# Patient Record
Sex: Male | Born: 1945 | Race: White | Hispanic: No | Marital: Single | Smoking: Former smoker
Health system: Southern US, Community
[De-identification: ages and names within clinical notes are randomized; demographics above are authoritative.]

## PROBLEM LIST (undated history)

## (undated) DIAGNOSIS — R0600 Dyspnea, unspecified: Secondary | ICD-10-CM

## (undated) DIAGNOSIS — E785 Hyperlipidemia, unspecified: Secondary | ICD-10-CM

## (undated) DIAGNOSIS — F32A Depression, unspecified: Secondary | ICD-10-CM

---
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
## 1998-09-27 DIAGNOSIS — J189 Pneumonia, unspecified organism: Secondary | ICD-10-CM

## 1998-09-27 HISTORY — PX: EYE SURGERY: SHX253

## 1998-09-27 HISTORY — DX: Pneumonia, unspecified organism: J18.9

## 1999-07-01 ENCOUNTER — Ambulatory Visit (HOSPITAL_COMMUNITY): Admission: RE | Admit: 1999-07-01 | Discharge: 1999-07-01 | Payer: Self-pay | Admitting: Gastroenterology

## 2007-01-20 ENCOUNTER — Inpatient Hospital Stay (HOSPITAL_COMMUNITY): Admission: EM | Admit: 2007-01-20 | Discharge: 2007-02-03 | Payer: Self-pay | Admitting: Emergency Medicine

## 2007-01-20 ENCOUNTER — Ambulatory Visit: Payer: Self-pay | Admitting: Pulmonary Disease

## 2007-01-20 ENCOUNTER — Ambulatory Visit: Payer: Self-pay | Admitting: Infectious Diseases

## 2008-09-27 DIAGNOSIS — J449 Chronic obstructive pulmonary disease, unspecified: Secondary | ICD-10-CM

## 2008-09-27 DIAGNOSIS — R519 Headache, unspecified: Secondary | ICD-10-CM

## 2008-09-27 HISTORY — DX: Chronic obstructive pulmonary disease, unspecified: J44.9

## 2008-09-27 HISTORY — DX: Headache, unspecified: R51.9

## 2015-09-28 HISTORY — PX: HERNIA REPAIR: SHX51

## 2015-09-28 HISTORY — PX: DIAGNOSTIC LAPAROSCOPY: SUR761

## 2017-09-27 DIAGNOSIS — I209 Angina pectoris, unspecified: Secondary | ICD-10-CM

## 2017-09-27 HISTORY — DX: Angina pectoris, unspecified: I20.9

## 2021-02-27 ENCOUNTER — Other Ambulatory Visit (HOSPITAL_COMMUNITY): Payer: Self-pay | Admitting: Student

## 2021-02-27 ENCOUNTER — Other Ambulatory Visit: Payer: Self-pay | Admitting: Student

## 2021-02-27 DIAGNOSIS — C3490 Malignant neoplasm of unspecified part of unspecified bronchus or lung: Secondary | ICD-10-CM

## 2021-03-13 ENCOUNTER — Other Ambulatory Visit: Payer: Self-pay

## 2021-03-13 ENCOUNTER — Ambulatory Visit (HOSPITAL_COMMUNITY)
Admission: RE | Admit: 2021-03-13 | Discharge: 2021-03-13 | Disposition: A | Discharge status: Home / Self Care | Payer: No Typology Code available for payment source | Source: Ambulatory Visit | Attending: Student | Admitting: Student

## 2021-03-13 DIAGNOSIS — C3490 Malignant neoplasm of unspecified part of unspecified bronchus or lung: Secondary | ICD-10-CM | POA: Diagnosis not present

## 2021-03-13 LAB — GLUCOSE, CAPILLARY: Glucose-Capillary: 97 mg/dL (ref 70–99)

## 2021-03-13 MED ORDER — FLUDEOXYGLUCOSE F - 18 (FDG) INJECTION
6.8000 | Freq: Once | INTRAVENOUS | Status: AC
  Administered 2021-03-13: 6.8 via INTRAVENOUS

## 2021-03-24 ENCOUNTER — Encounter: Payer: Self-pay | Admitting: Thoracic Surgery (Cardiothoracic Vascular Surgery)

## 2021-03-24 ENCOUNTER — Other Ambulatory Visit: Payer: Self-pay | Admitting: Thoracic Surgery (Cardiothoracic Vascular Surgery)

## 2021-03-24 ENCOUNTER — Other Ambulatory Visit: Payer: Self-pay

## 2021-03-24 ENCOUNTER — Institutional Professional Consult (permissible substitution) (INDEPENDENT_AMBULATORY_CARE_PROVIDER_SITE_OTHER): Payer: No Typology Code available for payment source | Admitting: Thoracic Surgery (Cardiothoracic Vascular Surgery)

## 2021-03-24 VITALS — BP 133/79 | HR 101 | Resp 20 | Wt 137.0 lb

## 2021-03-24 DIAGNOSIS — R911 Solitary pulmonary nodule: Secondary | ICD-10-CM

## 2021-03-24 NOTE — Progress Notes (Signed)
[address]       [place] [address]             [phone]                    Dezmen Carbary [place] Medical Record #[id number] Date of Birth: March 22, 1946  Referring: Rhoderick Moody, MD Primary Care: Windy Fast, MD Primary Cardiologist: None  Chief Complaint:    Chief Complaint  Patient presents with   Lung Lesion    Initial surgical consult PET 6/17, CT chest 4/25    History of Present Illness:    Kurt Bolton 75 y.o. male presents for surgical evaluation of a spiculated left upper lobe pulmonary nodule.  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  He continues to smoke but has cut down to half a pack a day.  He was in the lung cancer screening program through the [place], and was noted to have a suspicious nodule that was followed.  He subsequently underwent a PET/CT which showed avidity within this lesion.  He was then referred to cardiothoracic surgery for further evaluation.  The patient denies any weight changes, and has maintained her weight between 130 and 135 pounds for several years.  He denies any neurologic symptoms.  He does complain of groin pain but this is due to a recurrence in his inguinal hernia.  When he exerts himself he does have some shortness of breath, but denies any chest pain.    Smoking Hx: Continues to smoke half a pack a day.   Zubrod Score: At the time of surgery this patient's most appropriate activity status/level should be described as: []     0    Normal activity, no symptoms [x]     1    Restricted in physical strenuous activity but ambulatory, able to do out light work []     2    Ambulatory and capable of self care, unable to do work activities, up and about               >50 % of waking hours                              []     3    Only limited self care, in bed greater than 50% of waking hours []     4    Completely disabled, no self care, confined to bed or chair []     5    Moribund   No  past medical history on file.   No family history on file.   Social History   Tobacco Use  Smoking Status Not on file  Smokeless Tobacco Not on file    Social History   Substance and Sexual Activity  Alcohol Use Not on file     Not on File  Current Outpatient Medications  Medication Sig Dispense Refill   atorvastatin (LIPITOR) 40 MG tablet Take 40 mg by mouth daily.     gabapentin (NEURONTIN) 100 MG capsule Take 100 mg by mouth 3 (three) times daily.     Multiple Vitamin (MULTIVITAMIN) tablet Take 1 tablet by mouth daily.     No current facility-administered medications for this visit.    Review of Systems  Constitutional:  Negative for malaise/fatigue and weight loss.  Respiratory:  Positive for shortness of breath.   Cardiovascular:  Negative for chest pain.  Musculoskeletal:  Positive for myalgias.  Neurological: Negative.     PHYSICAL EXAMINATION: BP 133/79 (BP Location: Left Arm, Patient Position: Sitting)   Pulse (!) 101   Resp 20   Wt 137 lb (62.1 kg)   SpO2 94% Comment: RA Physical Exam Constitutional:      General: He is not in acute distress.    Appearance: He is ill-appearing. He is not toxic-appearing or diaphoretic.     Comments: Smells of smoke. Thin and very frail.  Somewhat disheveled.  HENT:     Head: Normocephalic and atraumatic.  Eyes:     Extraocular Movements: Extraocular movements intact.  Cardiovascular:     Rate and Rhythm: Tachycardia present.  Pulmonary:     Effort: Pulmonary effort is normal. No respiratory distress.  Abdominal:     General: Abdomen is flat. There is no distension.  Musculoskeletal:        General: Normal range of motion.     Cervical back: Normal range of motion.  Neurological:     General: No focal deficit present.     Mental Status: He is alert and oriented to person, place, and time.    Diagnostic Studies & Laboratory data:     Recent Radiology Findings:   NM PET Image Initial (PI) Skull Base To  Thigh  Result Date: 03/16/2021 CLINICAL DATA:  Initial treatment strategy for "staging of lung cancer". No further clinical data available or submitted. EXAM: NUCLEAR MEDICINE PET SKULL BASE TO THIGH TECHNIQUE: 6.8 mCi F-18 FDG was injected intravenously. Full-ring PET imaging was performed from the skull base to thigh after the radiotracer. CT data was obtained and used for attenuation correction and anatomic localization. Fasting blood glucose: 97 mg/dl COMPARISON:  No recent imaging available or submitted. A chest CT of 01/21/2007 is reviewed. FINDINGS: Mediastinal blood pool activity: SUV max 1.8 Liver activity: SUV max NA NECK: No areas of abnormal hypermetabolism. Incidental CT findings: Bilateral carotid atherosclerosis. No cervical adenopathy. CHEST: Left apical spiculated hypermetabolic pulmonary nodule including at 1.8 x 1.9 cm and a S.U.V. max of 10.1 on 29/4 and 7/8. No thoracic nodal hypermetabolism. Incidental CT findings: Right upper lobe cystic bronchiectasis and volume loss are the sequelae of necrotizing pneumonia when compared to the 2008 CT. Moderate centrilobular emphysema. Right middle lobe calcified granuloma. No thoracic adenopathy. There are calcified mediastinal and right hilar nodes which are likely related to old granulomatous disease. Aortic and coronary artery calcification. ABDOMEN/PELVIS: No abdominopelvic parenchymal or nodal hypermetabolism. Incidental CT findings: Normal adrenal glands. Old granulomatous disease in the spleen. Dependent gallstone or stones on 108/4. Abdominal aortic atherosclerosis. Infrarenal abdominal aortic dilatation at up to 3.2 cm. Left inguinal hernia contains nonobstructive left-sided colon. SKELETON: No abnormal marrow activity. Incidental CT findings: Osteopenia. IMPRESSION: 1. Hypermetabolic left apical pulmonary nodule, consistent with primary bronchogenic carcinoma. Presuming non-small-cell histology, T1bN0M0 or stage IA. 2. Sequelae of right upper  lobe cavitary pneumonia. 3. Aortic atherosclerosis (ICD10-I70.0), coronary artery atherosclerosis and emphysema (ICD10-J43.9). 4. Infrarenal abdominal aortic dilatation of up to 3.2 cm. Recommend follow-up ultrasound every 3 years. This recommendation follows ACR consensus guidelines: White Paper of the ACR Incidental Findings Committee II on Vascular Findings. J Am Coll Radiol 2013; 10:789-794. 5. Cholelithiasis 6. Nonobstructive colon containing left inguinal hernia. Electronically Signed   By: Abigail Miyamoto M.D.   On: 03/16/2021 09:36       I have independently reviewed the above radiology studies  and reviewed the findings with the patient.   Recent Lab  Findings: No results found for: WBC, HGB, HCT, PLT, GLUCOSE, CHOL, TRIG, HDL, LDLDIRECT, LDLCALC, ALT, AST, NA, K, CL, CREATININE, BUN, CO2, TSH, INR, GLUF, HGBA1C   PFTs: Pending Problem List: 1.9 cm left upper lobe pulmonary nodule with SUV of 10.1. Ongoing smoking Exertional dyspnea  Assessment / Plan:   75 year old male with a 1.9 cm left upper lobe pulmonary nodule with SUV uptake of 10.1.  Given his smoking history this is very concerning for primary lung cancer.  We will require pulmonary function testing to assess his lung reserve to determine whether or not he is a surgical candidate.  He states that he is willing to stop smoking.  If he is a surgical candidate he will also need a stress test given his exertional dyspnea.:  Once the pulmonary function tests are resulted, will discuss his case with Dr.Icard for obtaining a tissue diagnosis.  He is scheduled for a virtual visit with me after his pulmonary function testing.  He also appears quite frail and does not have much if any social support.     I  spent 40 minutes with  the patient face to face in counseling and coordination of care.    Lajuana Matte 03/24/2021 4:14 PM

## 2021-03-26 ENCOUNTER — Encounter: Payer: No Typology Code available for payment source | Admitting: Thoracic Surgery (Cardiothoracic Vascular Surgery)

## 2021-03-31 ENCOUNTER — Other Ambulatory Visit (HOSPITAL_COMMUNITY): Payer: No Typology Code available for payment source

## 2021-04-01 ENCOUNTER — Other Ambulatory Visit (HOSPITAL_COMMUNITY)
Admission: RE | Admit: 2021-04-01 | Discharge: 2021-04-01 | Disposition: A | Discharge status: Home / Self Care | Payer: No Typology Code available for payment source | Source: Ambulatory Visit | Attending: Thoracic Surgery (Cardiothoracic Vascular Surgery) | Admitting: Thoracic Surgery (Cardiothoracic Vascular Surgery)

## 2021-04-01 DIAGNOSIS — Z01812 Encounter for preprocedural laboratory examination: Secondary | ICD-10-CM | POA: Insufficient documentation

## 2021-04-01 DIAGNOSIS — Z20822 Contact with and (suspected) exposure to covid-19: Secondary | ICD-10-CM | POA: Diagnosis not present

## 2021-04-02 ENCOUNTER — Inpatient Hospital Stay (HOSPITAL_COMMUNITY): Admission: RE | Admit: 2021-04-02 | Payer: No Typology Code available for payment source | Source: Ambulatory Visit

## 2021-04-02 LAB — SARS CORONAVIRUS 2 (TAT 6-24 HRS): SARS Coronavirus 2: NEGATIVE

## 2021-04-03 ENCOUNTER — Ambulatory Visit (HOSPITAL_COMMUNITY)
Admission: RE | Admit: 2021-04-03 | Discharge: 2021-04-03 | Disposition: A | Discharge status: Home / Self Care | Payer: No Typology Code available for payment source | Source: Ambulatory Visit | Attending: Thoracic Surgery (Cardiothoracic Vascular Surgery) | Admitting: Thoracic Surgery (Cardiothoracic Vascular Surgery)

## 2021-04-03 ENCOUNTER — Other Ambulatory Visit: Payer: Self-pay | Admitting: Thoracic Surgery (Cardiothoracic Vascular Surgery)

## 2021-04-03 ENCOUNTER — Other Ambulatory Visit: Payer: Self-pay

## 2021-04-03 ENCOUNTER — Telehealth (INDEPENDENT_AMBULATORY_CARE_PROVIDER_SITE_OTHER): Payer: No Typology Code available for payment source | Admitting: Thoracic Surgery (Cardiothoracic Vascular Surgery)

## 2021-04-03 DIAGNOSIS — R911 Solitary pulmonary nodule: Secondary | ICD-10-CM

## 2021-04-03 LAB — PULMONARY FUNCTION TEST
DL/VA % pred: 69 %
DL/VA: 2.7 ml/min/mmHg/L
DLCO unc % pred: 55 %
DLCO unc: 14.83 ml/min/mmHg
FEF 25-75 Post: 0.62 L/sec
FEF 25-75 Pre: 1.52 L/sec
FEF2575-%Change-Post: -58 %
FEF2575-%Pred-Post: 26 %
FEF2575-%Pred-Pre: 64 %
FEV1-%Change-Post: -4 %
FEV1-%Pred-Post: 72 %
FEV1-%Pred-Pre: 76 %
FEV1-Post: 2.38 L
FEV1-Pre: 2.5 L
FEV1FVC-%Change-Post: -7 %
FEV1FVC-%Pred-Pre: 93 %
FEV6-%Change-Post: -4 %
FEV6-%Pred-Post: 82 %
FEV6-%Pred-Pre: 86 %
FEV6-Post: 3.51 L
FEV6-Pre: 3.67 L
FEV6FVC-%Change-Post: -4 %
FEV6FVC-%Pred-Post: 101 %
FEV6FVC-%Pred-Pre: 106 %
FVC-%Change-Post: 3 %
FVC-%Pred-Post: 84 %
FVC-%Pred-Pre: 81 %
FVC-Post: 3.81 L
FVC-Pre: 3.68 L
Post FEV1/FVC ratio: 62 %
Post FEV6/FVC ratio: 95 %
Pre FEV1/FVC ratio: 68 %
Pre FEV6/FVC Ratio: 100 %
RV % pred: 118 %
RV: 3.19 L
TLC % pred: 92 %
TLC: 6.92 L

## 2021-04-03 MED ORDER — ALBUTEROL SULFATE (2.5 MG/3ML) 0.083% IN NEBU
2.5000 mg | INHALATION_SOLUTION | Freq: Once | RESPIRATORY_TRACT | Status: AC
  Administered 2021-04-03: 2.5 mg via RESPIRATORY_TRACT

## 2021-04-03 NOTE — H&P (View-Only) (Signed)
     [address]       [place] [address]             [phone]       Patient: Home Provider: Office Consent for Telemedicine visit obtained.  Today's visit was completed via a real-time telehealth (see specific modality noted below). The patient/authorized person provided oral consent at the time of the visit to engage in a telemedicine encounter with the present provider at [facility]. The patient/authorized person was informed of the potential benefits, limitations, and risks of telemedicine. The patient/authorized person expressed understanding that the laws that protect confidentiality also apply to telemedicine. The patient/authorized person acknowledged understanding that telemedicine does not provide emergency services and that he or she would need to call 911 or proceed to the nearest hospital for help if such a need arose.   Total time spent in the clinical discussion 10 minutes.  Telehealth Modality: Phone visit (audio only)  I had a telephone visit with Kurt Bolton.  This is a 75 year old gentleman with a left upper lobe pulmonary nodule concerning for primary malignancy.  I reviewed his pulmonary function tests most are fine except for his DLCO which is 55%.  He does continue to smoke but is cut down.  He would like to proceed with surgery and states that he will be off cigarettes as of today.  We will schedule him for a combination procedure with Dr.Icard which will include a navigational bronchoscopy as well as a robotic assisted left upper lobectomy.  I have put in for stress test and following the results we will schedule surgery within the next 2 weeks.

## 2021-04-03 NOTE — Progress Notes (Signed)
     [address]       [place] [address]             [phone]       Patient: Home Provider: Office Consent for Telemedicine visit obtained.  Today's visit was completed via a real-time telehealth (see specific modality noted below). The patient/authorized person provided oral consent at the time of the visit to engage in a telemedicine encounter with the present provider at [facility]. The patient/authorized person was informed of the potential benefits, limitations, and risks of telemedicine. The patient/authorized person expressed understanding that the laws that protect confidentiality also apply to telemedicine. The patient/authorized person acknowledged understanding that telemedicine does not provide emergency services and that he or she would need to call 911 or proceed to the nearest hospital for help if such a need arose.   Total time spent in the clinical discussion 10 minutes.  Telehealth Modality: Phone visit (audio only)  I had a telephone visit with Kurt Bolton.  This is a 75 year old gentleman with a left upper lobe pulmonary nodule concerning for primary malignancy.  I reviewed his pulmonary function tests most are fine except for his DLCO which is 55%.  He does continue to smoke but is cut down.  He would like to proceed with surgery and states that he will be off cigarettes as of today.  We will schedule him for a combination procedure with Dr.Icard which will include a navigational bronchoscopy as well as a robotic assisted left upper lobectomy.  I have put in for stress test and following the results we will schedule surgery within the next 2 weeks.

## 2021-04-07 ENCOUNTER — Telehealth (HOSPITAL_COMMUNITY): Payer: Self-pay | Admitting: *Deleted

## 2021-04-07 NOTE — Telephone Encounter (Signed)
Patient given detailed instructions per Myocardial Perfusion Study Information Sheet for the test on 04/09/21 at 1100. Patient notified to arrive 15 minutes early and that it is imperative to arrive on time for appointment to keep from having the test rescheduled.  If you need to cancel or reschedule your appointment, please call the office within 24 hours of your appointment. . Patient verbalized understanding.Lundon Verdejo, Kurt Bolton No mychart

## 2021-04-09 ENCOUNTER — Telehealth: Payer: Self-pay | Admitting: Pulmonary Disease

## 2021-04-09 ENCOUNTER — Other Ambulatory Visit: Payer: Self-pay | Admitting: *Deleted

## 2021-04-09 ENCOUNTER — Telehealth: Payer: Self-pay | Admitting: *Deleted

## 2021-04-09 ENCOUNTER — Other Ambulatory Visit: Payer: Self-pay

## 2021-04-09 ENCOUNTER — Ambulatory Visit (HOSPITAL_COMMUNITY): Payer: No Typology Code available for payment source | Attending: Cardiovascular Disease

## 2021-04-09 ENCOUNTER — Encounter: Payer: Self-pay | Admitting: *Deleted

## 2021-04-09 VITALS — Ht 72.0 in | Wt 137.0 lb

## 2021-04-09 DIAGNOSIS — Z01810 Encounter for preprocedural cardiovascular examination: Secondary | ICD-10-CM

## 2021-04-09 DIAGNOSIS — R911 Solitary pulmonary nodule: Secondary | ICD-10-CM

## 2021-04-09 DIAGNOSIS — Z01818 Encounter for other preprocedural examination: Secondary | ICD-10-CM | POA: Insufficient documentation

## 2021-04-09 LAB — MYOCARDIAL PERFUSION IMAGING
LV dias vol: 69 mL (ref 62–150)
LV sys vol: 18 mL
Peak HR: 86 {beats}/min
Rest HR: 54 {beats}/min
SDS: 0
SRS: 0
SSS: 0
TID: 0.95

## 2021-04-09 MED ORDER — TECHNETIUM TC 99M TETROFOSMIN IV KIT
11.0000 | PACK | Freq: Once | INTRAVENOUS | Status: AC | PRN
  Administered 2021-04-09: 11 via INTRAVENOUS
  Filled 2021-04-09: qty 11

## 2021-04-09 MED ORDER — REGADENOSON 0.4 MG/5ML IV SOLN
0.4000 mg | Freq: Once | INTRAVENOUS | Status: AC
  Administered 2021-04-09: 0.4 mg via INTRAVENOUS

## 2021-04-09 MED ORDER — TECHNETIUM TC 99M TETROFOSMIN IV KIT
32.4000 | PACK | Freq: Once | INTRAVENOUS | Status: AC | PRN
  Administered 2021-04-09: 32.4 via INTRAVENOUS
  Filled 2021-04-09: qty 33

## 2021-04-09 NOTE — Telephone Encounter (Signed)
PCCM: orders placed for dye mark case with Dr. Kipp Brood on 8/4 CT chest, case request  Please see other encounter.   [facility] 04/09/2021 5:46 PM

## 2021-04-09 NOTE — Telephone Encounter (Signed)
PCCM:  Case discussed with Dr. Kipp Brood, plans for combined RAB+RAT.   Orders placed for case request as well as super D CT imaging..  Case request on 04/30/2021.  [facility] 04/09/2021 5:34 PM

## 2021-04-09 NOTE — Telephone Encounter (Signed)
Patient is scheduled for a Combo case (ENB/Left RATs, left upper lobectomy) on Thursday 8/4 with Drs. Icard and Lightfoot.  Orders placed and pre-op appt scheduled.

## 2021-04-10 ENCOUNTER — Telehealth: Payer: Self-pay | Admitting: Pulmonary Disease

## 2021-04-10 NOTE — Telephone Encounter (Signed)
Called patient but he did not answer. Left message for him to call back.  

## 2021-04-10 NOTE — Telephone Encounter (Signed)
Pt aware of the following:  CT sched for 7/27 at 11:30am; 11:15 arrival at [facility]. Disk being sent to Endo.   ENB Sched for 8/4 at 7:30am; 5:30am arrival.   Covid test sched by Dr. Elliot Gault office for 8/2.    Pt has question about the need for ENB procedure. Please advise.

## 2021-04-14 NOTE — Telephone Encounter (Signed)
Called and spoke to pt. Pt had questions on where his scan, covid test, and procedure are located and where to go exactly. Pt also states he was under the impression of he was going to be mailed a letter with information about these appts. Advised pt I would have Tasia call him back with this information. Pt verbalized understanding.   Will forward to Tasia to address.

## 2021-04-14 NOTE — Telephone Encounter (Signed)
Called and spoke w/ pt about procedure details.   LVM for Levonne Spiller w/ Dr. Elliot Gault office to call pt w/ covid testing details b/c it was sched by there office.   Pt states understanding. Nothing further needed at this time.

## 2021-04-22 ENCOUNTER — Other Ambulatory Visit: Payer: Self-pay

## 2021-04-22 ENCOUNTER — Ambulatory Visit (INDEPENDENT_AMBULATORY_CARE_PROVIDER_SITE_OTHER)
Admission: RE | Admit: 2021-04-22 | Discharge: 2021-04-22 | Disposition: A | Discharge status: Home / Self Care | Payer: No Typology Code available for payment source | Source: Ambulatory Visit | Attending: Pulmonary Disease | Admitting: Pulmonary Disease

## 2021-04-22 DIAGNOSIS — R911 Solitary pulmonary nodule: Secondary | ICD-10-CM

## 2021-04-24 ENCOUNTER — Telehealth: Payer: No Typology Code available for payment source | Admitting: Thoracic Surgery (Cardiothoracic Vascular Surgery)

## 2021-04-27 NOTE — Progress Notes (Signed)
Surgical Instructions    Your procedure is scheduled on 04/30/21.  Report to [facility] Main Entrance "A" at 5:30 A.M., then check in with the Admitting office.  Call this number if you have problems the morning of surgery:  [phone]   If you have any questions prior to your surgery date call [phone]: Open Monday-Friday 8am-4pm    Remember:  Do not eat after midnight the night before your surgery  You may drink clear liquids until 4:30 the morning of your surgery.   Clear liquids allowed are: Water, Non-Citrus Juices (without pulp), Carbonated Beverages, Clear Tea, Black Coffee Only, and Gatorade    Take these medicines the morning of surgery with A SIP OF WATER:  AS NEEDED:  buPROPion ER (WELLBUTRIN SR)  oxymetazoline (AFRIN)  phenylephrine (NEO-SYNEPHRINE)    As of today, STOP taking any Aspirin (unless otherwise instructed by your surgeon) Aleve, Naproxen, Ibuprofen, Motrin, Advil, Goody's, BC's, all herbal medications, fish oil, and all vitamins.          Do not wear jewelry  Do not wear lotions, powders, colognes, or deodorant. Do not shave 48 hours prior to surgery.  Men may shave face and neck. Do not bring valuables to the hospital.              [facility] is not responsible for any belongings or valuables.  Do NOT Smoke (Tobacco/Vaping) or drink Alcohol 24 hours prior to your procedure If you use a CPAP at night, you may bring all equipment for your overnight stay.   Contacts, glasses, dentures or bridgework may not be worn into surgery, please bring cases for these belongings   For patients admitted to the hospital, discharge time will be determined by your treatment team.   Patients discharged the day of surgery will not be allowed to drive home, and someone needs to stay with them for 24 hours.  ONLY 1 SUPPORT PERSON MAY BE PRESENT WHILE YOU ARE IN SURGERY. IF YOU ARE TO BE ADMITTED ONCE YOU ARE IN YOUR ROOM YOU WILL BE ALLOWED TWO (2) VISITORS.  Minor  children may have two parents present. Special consideration for safety and communication needs will be reviewed on a case by case basis.  Special instructions:    Oral Hygiene is also important to reduce your risk of infection.  Remember - BRUSH YOUR TEETH THE MORNING OF SURGERY WITH YOUR REGULAR TOOTHPASTE   [place]- Preparing For Surgery  Before surgery, you can play an important role. Because skin is not sterile, your skin needs to be as free of germs as possible. You can reduce the number of germs on your skin by washing with CHG (chlorahexidine gluconate) Soap before surgery.  CHG is an antiseptic cleaner which kills germs and bonds with the skin to continue killing germs even after washing.     Please do not use if you have an allergy to CHG or antibacterial soaps. If your skin becomes reddened/irritated stop using the CHG.  Do not shave (including legs and underarms) for at least 48 hours prior to first CHG shower. It is OK to shave your face.  Please follow these instructions carefully.     Shower the NIGHT BEFORE SURGERY and the MORNING OF SURGERY with CHG Soap.   If you chose to wash your hair, wash your hair first as usual with your normal shampoo. After you shampoo, rinse your hair and body thoroughly to remove the shampoo.  Then [employer] and genitals (private  parts) with your normal soap and rinse thoroughly to remove soap.  After that Use CHG Soap as you would any other liquid soap. You can apply CHG directly to the skin and wash gently with a scrungie or a clean washcloth.   Apply the CHG Soap to your body ONLY FROM THE NECK DOWN.  Do not use on open wounds or open sores. Avoid contact with your eyes, ears, mouth and genitals (private parts). Wash Face and genitals (private parts)  with your normal soap.   Wash thoroughly, paying special attention to the area where your surgery will be performed.  Thoroughly rinse your body with warm water from the neck down.  DO NOT  shower/wash with your normal soap after using and rinsing off the CHG Soap.  Pat yourself dry with a CLEAN TOWEL.  Wear CLEAN PAJAMAS to bed the night before surgery  Place CLEAN SHEETS on your bed the night before your surgery  DO NOT SLEEP WITH PETS.   Day of Surgery:  Take a shower with CHG soap. Wear Clean/Comfortable clothing the morning of surgery Do not apply any deodorants/lotions.   Remember to brush your teeth WITH YOUR REGULAR TOOTHPASTE.   Please read over the following fact sheets that you were given.

## 2021-04-28 ENCOUNTER — Other Ambulatory Visit: Payer: Self-pay

## 2021-04-28 ENCOUNTER — Encounter (HOSPITAL_COMMUNITY)
Admission: RE | Admit: 2021-04-28 | Discharge: 2021-04-28 | Disposition: A | Discharge status: Home / Self Care | Payer: No Typology Code available for payment source | Source: Ambulatory Visit | Attending: Thoracic Surgery (Cardiothoracic Vascular Surgery) | Admitting: Thoracic Surgery (Cardiothoracic Vascular Surgery)

## 2021-04-28 ENCOUNTER — Encounter (HOSPITAL_COMMUNITY): Payer: Self-pay

## 2021-04-28 ENCOUNTER — Encounter (HOSPITAL_COMMUNITY)
Admission: RE | Admit: 2021-04-28 | Discharge: 2021-04-28 | Disposition: A | Discharge status: Home / Self Care | Payer: No Typology Code available for payment source | Source: Ambulatory Visit | Attending: Pulmonary Disease | Admitting: Pulmonary Disease

## 2021-04-28 DIAGNOSIS — Z01818 Encounter for other preprocedural examination: Secondary | ICD-10-CM | POA: Insufficient documentation

## 2021-04-28 DIAGNOSIS — R911 Solitary pulmonary nodule: Secondary | ICD-10-CM

## 2021-04-28 DIAGNOSIS — J479 Bronchiectasis, uncomplicated: Secondary | ICD-10-CM | POA: Insufficient documentation

## 2021-04-28 DIAGNOSIS — R222 Localized swelling, mass and lump, trunk: Secondary | ICD-10-CM | POA: Insufficient documentation

## 2021-04-28 DIAGNOSIS — Z20822 Contact with and (suspected) exposure to covid-19: Secondary | ICD-10-CM | POA: Insufficient documentation

## 2021-04-28 LAB — SURGICAL PCR SCREEN
MRSA, PCR: NEGATIVE
Staphylococcus aureus: NEGATIVE

## 2021-04-28 LAB — COMPREHENSIVE METABOLIC PANEL
ALT: 24 U/L (ref 0–44)
AST: 28 U/L (ref 15–41)
Albumin: 3.7 g/dL (ref 3.5–5.0)
Alkaline Phosphatase: 71 U/L (ref 38–126)
Anion gap: 7 (ref 5–15)
BUN: 17 mg/dL (ref 8–23)
CO2: 22 mmol/L (ref 22–32)
Calcium: 9.2 mg/dL (ref 8.9–10.3)
Chloride: 107 mmol/L (ref 98–111)
Creatinine, Ser: 0.77 mg/dL (ref 0.61–1.24)
GFR, Estimated: >60 mL/min (ref >60)
Glucose, Bld: 95 mg/dL (ref 70–99)
Potassium: 4.2 mmol/L (ref 3.5–5.1)
Sodium: 136 mmol/L (ref 135–145)
Total Bilirubin: 0.7 mg/dL (ref 0.3–1.2)
Total Protein: 6.1 g/dL — ABNORMAL LOW (ref 6.5–8.1)

## 2021-04-28 LAB — BLOOD GAS, ARTERIAL
Acid-base deficit: 0.2 mmol/L (ref 0.0–2.0)
Bicarbonate: 23.7 mmol/L (ref 20.0–28.0)
Drawn by: 58793
FIO2: 21
O2 Saturation: 98 %
Patient temperature: 37
pCO2 arterial: 37.2 mmHg (ref 32.0–48.0)
pH, Arterial: 7.42 (ref 7.350–7.450)
pO2, Arterial: 98.3 mmHg (ref 83.0–108.0)

## 2021-04-28 LAB — URINALYSIS, ROUTINE W REFLEX MICROSCOPIC
Bilirubin Urine: NEGATIVE
Glucose, UA: NEGATIVE mg/dL
Hgb urine dipstick: NEGATIVE
Ketones, ur: 5 mg/dL — AB
Nitrite: NEGATIVE
Protein, ur: 100 mg/dL — AB
Specific Gravity, Urine: 1.029 (ref 1.005–1.030)
pH: 5 (ref 5.0–8.0)

## 2021-04-28 LAB — CBC
HCT: 38.1 % — ABNORMAL LOW (ref 39.0–52.0)
Hemoglobin: 13.1 g/dL (ref 13.0–17.0)
MCH: 32.8 pg (ref 26.0–34.0)
MCHC: 34.4 g/dL (ref 30.0–36.0)
MCV: 95.5 fL (ref 80.0–100.0)
Platelets: 232 10*3/uL (ref 150–400)
RBC: 3.99 MIL/uL — ABNORMAL LOW (ref 4.22–5.81)
RDW: 13.2 % (ref 11.5–15.5)
WBC: 9.2 10*3/uL (ref 4.0–10.5)
nRBC: 0 % (ref 0.0–0.2)

## 2021-04-28 LAB — PROTIME-INR
INR: 1.1 (ref 0.8–1.2)
Prothrombin Time: 13.8 seconds (ref 11.4–15.2)

## 2021-04-28 LAB — APTT: aPTT: 35 seconds (ref 24–36)

## 2021-04-28 LAB — SARS CORONAVIRUS 2 (TAT 6-24 HRS): SARS Coronavirus 2: NEGATIVE

## 2021-04-28 NOTE — Progress Notes (Addendum)
Surgical Instructions    Your procedure is scheduled on 04/30/21.  Report to [facility] Main Entrance "A" at 5:30 A.M., then check in with the Admitting office.  Call this number if you have problems the morning of surgery:  [phone]   If you have any questions prior to your surgery date call [phone]: Open Monday-Friday 8am-4pm    Remember:  Do not eat or drink after midnight the night before your surgery   Take these medicines the morning of surgery with A SIP OF WATER:  AS NEEDED:  buPROPion ER (WELLBUTRIN SR)  oxymetazoline (AFRIN)  phenylephrine (NEO-SYNEPHRINE)    As of today, STOP taking any Aspirin (unless otherwise instructed by your surgeon) Aleve, Naproxen, Ibuprofen, Motrin, Advil, Goody's, BC's, all herbal medications, fish oil, and all vitamins.          Do not wear jewelry  Do not wear lotions, powders, colognes, or deodorant. Men may shave face and neck. Do not bring valuables to the hospital.              [facility] is not responsible for any belongings or valuables.  Do NOT Smoke (Tobacco/Vaping) or drink Alcohol 24 hours prior to your procedure If you use a CPAP at night, you may bring all equipment for your overnight stay.   Contacts, glasses, dentures or bridgework may not be worn into surgery, please bring cases for these belongings   For patients admitted to the hospital, discharge time will be determined by your treatment team.   Patients discharged the day of surgery will not be allowed to drive home, and someone needs to stay with them for 24 hours.  ONLY 1 SUPPORT PERSON MAY BE PRESENT WHILE YOU ARE IN SURGERY. IF YOU ARE TO BE ADMITTED ONCE YOU ARE IN YOUR ROOM YOU WILL BE ALLOWED TWO (2) VISITORS.  Minor children may have two parents present. Special consideration for safety and communication needs will be reviewed on a case by case basis.  Special instructions:    Oral Hygiene is also important to reduce your risk of infection.   Remember - BRUSH YOUR TEETH THE MORNING OF SURGERY WITH YOUR REGULAR TOOTHPASTE   [place]- Preparing For Surgery  Before surgery, you can play an important role. Because skin is not sterile, your skin needs to be as free of germs as possible. You can reduce the number of germs on your skin by washing with CHG (chlorahexidine gluconate) Soap before surgery.  CHG is an antiseptic cleaner which kills germs and bonds with the skin to continue killing germs even after washing.     Please do not use if you have an allergy to CHG or antibacterial soaps. If your skin becomes reddened/irritated stop using the CHG.  Do not shave (including legs and underarms) for at least 48 hours prior to first CHG shower. It is OK to shave your face.  Please follow these instructions carefully.     Shower the NIGHT BEFORE SURGERY and the MORNING OF SURGERY with CHG Soap.   If you chose to wash your hair, wash your hair first as usual with your normal shampoo. After you shampoo, rinse your hair and body thoroughly to remove the shampoo.  Then [employer] and genitals (private parts) with your normal soap and rinse thoroughly to remove soap.  After that Use CHG Soap as you would any other liquid soap. You can apply CHG directly to the skin and wash gently with a scrungie or a clean  washcloth.   Apply the CHG Soap to your body ONLY FROM THE NECK DOWN.  Do not use on open wounds or open sores. Avoid contact with your eyes, ears, mouth and genitals (private parts). Wash Face and genitals (private parts)  with your normal soap.   Wash thoroughly, paying special attention to the area where your surgery will be performed.  Thoroughly rinse your body with warm water from the neck down.  DO NOT shower/wash with your normal soap after using and rinsing off the CHG Soap.  Pat yourself dry with a CLEAN TOWEL.  Wear CLEAN PAJAMAS to bed the night before surgery  Place CLEAN SHEETS on your bed the night before your  surgery  DO NOT SLEEP WITH PETS.   Day of Surgery:  Take a shower with CHG soap. Wear Clean/Comfortable clothing the morning of surgery Do not apply any deodorants/lotions.   Remember to brush your teeth WITH YOUR REGULAR TOOTHPASTE.   Please read over the following fact sheets that you were given.

## 2021-04-28 NOTE — Progress Notes (Addendum)
PCP - Windy Fast, MD Cardiologist - denies  PPM/ICD - denies  Chest x-ray - 01-30-2007 EKG - 11-19-2016 (records requested from [facility] and [facility]) Stress Test - 04-09-21 ECHO - denies Cardiac Cath - denies  Sleep Study - denies  DM: denies   ERAS Protcol - No  COVID TEST-  swabbed at PAT appt 04/28/21. Results pending   Anesthesia review: Yes, abnormal UA. Records requested for previous labs/tests.  Patient denies shortness of breath, fever, cough and chest pain at PAT appointment   All instructions explained to the patient, with a verbal understanding of the material. Patient agrees to go over the instructions while at home for a better understanding. The opportunity to ask questions was provided.

## 2021-04-29 ENCOUNTER — Encounter (HOSPITAL_COMMUNITY): Payer: Self-pay | Admitting: Pulmonary Disease

## 2021-04-29 NOTE — Progress Notes (Signed)
DUE TO COVID-19 ONLY ONE VISITOR IS ALLOWED TO COME WITH YOU AND STAY IN THE WAITING ROOM ONLY DURING PRE OP AND PROCEDURE DAY OF SURGERY.   Two VISITORS  MAY VISIT WITH YOU AFTER SURGERY IN YOUR PRIVATE ROOM DURING VISITING HOURS ONLY!  PCP - [facility]  Cardiologist - n/a Oncology - Dr Carlean Jews, [facility]  Chest x-ray - 04/28/21 (2V) EKG - 04/28/21 Stress Test - 04/09/21 ECHO - n/a Cardiac Cath - n/a  Sleep Study -  n/a CPAP - none  Aspirin Instructions: Last dose of ASA was on 04/27/21.  STOP now taking any Aspirin (unless otherwise instructed by your surgeon), Aleve, Naproxen, Ibuprofen, Motrin, Advil, Goody's, BC's, all herbal medications, fish oil, and all vitamins.   Coronavirus Screening Covid test on 04/28/21 was negative.    Do you have any of the following symptoms:  Cough yes/no: No Fever (>100.55F)  yes/no: No Runny nose yes/no: No Sore throat yes/no: No Difficulty breathing/shortness of breath  yes/no: No  Have you traveled in the last 14 days and where? yes/no: No  Patient verbalized understanding of instructions that were given via phone.-

## 2021-04-29 NOTE — Anesthesia Preprocedure Evaluation (Addendum)
Anesthesia Evaluation  Patient identified by MRN, date of birth, ID band Patient awake    Reviewed: Allergy & Precautions, NPO status , Patient's Chart, lab work & pertinent test results  Airway Mallampati: II  TM Distance: >3 FB Neck ROM: Full    Dental  (+) Upper Dentures, Lower Dentures   Pulmonary COPD, former smoker,  Pulmonary nodule    Pulmonary exam normal        Cardiovascular + angina  Rhythm:Regular Rate:Normal     Neuro/Psych  Headaches, Depression    GI/Hepatic negative GI ROS, Neg liver ROS,   Endo/Other  negative endocrine ROS  Renal/GU negative Renal ROS  negative genitourinary   Musculoskeletal negative musculoskeletal ROS (+)   Abdominal (+)  Abdomen: soft.    Peds  Hematology negative hematology ROS (+)   Anesthesia Other Findings   Reproductive/Obstetrics                           Anesthesia Physical Anesthesia Plan  ASA: 3  Anesthesia Plan: General   Post-op Pain Management:    Induction: Intravenous  PONV Risk Score and Plan: 2 and Ondansetron, Dexamethasone and Treatment may vary due to age or medical condition  Airway Management Planned: Mask and Double Lumen EBT  Additional Equipment: Arterial line  Intra-op Plan:   Post-operative Plan: Extubation in OR  Informed Consent: I have reviewed the patients History and Physical, chart, labs and discussed the procedure including the risks, benefits and alternatives for the proposed anesthesia with the patient or authorized representative who has indicated his/her understanding and acceptance.     Dental advisory given  Plan Discussed with: CRNA  Anesthesia Plan Comments: (Lab Results      Component                Value               Date                      WBC                      9.2                 04/28/2021                HGB                      13.1                04/28/2021                 HCT                      38.1 (L)            04/28/2021                MCV                      95.5                04/28/2021                PLT                      232  04/28/2021           Lab Results      Component                Value               Date                      NA                       136                 04/28/2021                K                        4.2                 04/28/2021                CO2                      22                  04/28/2021                GLUCOSE                  95                  04/28/2021                BUN                      17                  04/28/2021                CREATININE               0.77                04/28/2021                CALCIUM                  9.2                 04/28/2021                GFRNONAA                 >60                 04/28/2021           Nuclear stress 04/09/21: The left ventricular ejection fraction is hyperdynamic (>65%). Nuclear stress EF: 74%. The study is normal.   Normal resting and stress perfusion. No ischemia or infarction EF 74% )      Anesthesia Quick Evaluation

## 2021-04-30 ENCOUNTER — Ambulatory Visit (HOSPITAL_COMMUNITY): Payer: No Typology Code available for payment source | Admitting: Certified Registered"

## 2021-04-30 ENCOUNTER — Ambulatory Visit (HOSPITAL_COMMUNITY): Payer: No Typology Code available for payment source | Admitting: Physician Assistant

## 2021-04-30 ENCOUNTER — Ambulatory Visit (HOSPITAL_COMMUNITY): Payer: No Typology Code available for payment source

## 2021-04-30 ENCOUNTER — Encounter (HOSPITAL_COMMUNITY): Payer: Self-pay | Admitting: Pulmonary Disease

## 2021-04-30 ENCOUNTER — Other Ambulatory Visit: Payer: Self-pay

## 2021-04-30 ENCOUNTER — Encounter (HOSPITAL_COMMUNITY)
Admission: AD | Disposition: A | Discharge status: Home / Self Care | Payer: Self-pay | Source: Home / Self Care | Attending: Thoracic Surgery (Cardiothoracic Vascular Surgery)

## 2021-04-30 ENCOUNTER — Inpatient Hospital Stay (HOSPITAL_COMMUNITY): Payer: No Typology Code available for payment source

## 2021-04-30 ENCOUNTER — Inpatient Hospital Stay (HOSPITAL_COMMUNITY)
Admission: AD | Admit: 2021-04-30 | Discharge: 2021-05-20 | DRG: 163 | Disposition: A | Discharge status: Skilled Nursing Facility | Payer: No Typology Code available for payment source | Attending: Thoracic Surgery (Cardiothoracic Vascular Surgery) | Admitting: Thoracic Surgery (Cardiothoracic Vascular Surgery)

## 2021-04-30 DIAGNOSIS — C3412 Malignant neoplasm of upper lobe, left bronchus or lung: Secondary | ICD-10-CM | POA: Diagnosis present

## 2021-04-30 DIAGNOSIS — R571 Hypovolemic shock: Secondary | ICD-10-CM | POA: Diagnosis not present

## 2021-04-30 DIAGNOSIS — J9382 Other air leak: Secondary | ICD-10-CM | POA: Diagnosis present

## 2021-04-30 DIAGNOSIS — K4090 Unilateral inguinal hernia, without obstruction or gangrene, not specified as recurrent: Secondary | ICD-10-CM | POA: Diagnosis present

## 2021-04-30 DIAGNOSIS — J982 Interstitial emphysema: Secondary | ICD-10-CM | POA: Diagnosis not present

## 2021-04-30 DIAGNOSIS — Z20822 Contact with and (suspected) exposure to covid-19: Secondary | ICD-10-CM | POA: Diagnosis present

## 2021-04-30 DIAGNOSIS — F17210 Nicotine dependence, cigarettes, uncomplicated: Secondary | ICD-10-CM | POA: Diagnosis present

## 2021-04-30 DIAGNOSIS — Z4682 Encounter for fitting and adjustment of non-vascular catheter: Secondary | ICD-10-CM

## 2021-04-30 DIAGNOSIS — Z902 Acquired absence of lung [part of]: Secondary | ICD-10-CM

## 2021-04-30 DIAGNOSIS — Z9689 Presence of other specified functional implants: Secondary | ICD-10-CM

## 2021-04-30 DIAGNOSIS — K5900 Constipation, unspecified: Secondary | ICD-10-CM | POA: Diagnosis present

## 2021-04-30 DIAGNOSIS — D62 Acute posthemorrhagic anemia: Secondary | ICD-10-CM | POA: Diagnosis not present

## 2021-04-30 DIAGNOSIS — J939 Pneumothorax, unspecified: Secondary | ICD-10-CM

## 2021-04-30 DIAGNOSIS — Z419 Encounter for procedure for purposes other than remedying health state, unspecified: Secondary | ICD-10-CM

## 2021-04-30 DIAGNOSIS — I959 Hypotension, unspecified: Secondary | ICD-10-CM | POA: Diagnosis not present

## 2021-04-30 DIAGNOSIS — J449 Chronic obstructive pulmonary disease, unspecified: Secondary | ICD-10-CM | POA: Diagnosis present

## 2021-04-30 DIAGNOSIS — J942 Hemothorax: Secondary | ICD-10-CM | POA: Diagnosis not present

## 2021-04-30 DIAGNOSIS — J8410 Pulmonary fibrosis, unspecified: Secondary | ICD-10-CM | POA: Diagnosis present

## 2021-04-30 DIAGNOSIS — D72829 Elevated white blood cell count, unspecified: Secondary | ICD-10-CM | POA: Diagnosis present

## 2021-04-30 DIAGNOSIS — R911 Solitary pulmonary nodule: Secondary | ICD-10-CM | POA: Diagnosis present

## 2021-04-30 DIAGNOSIS — I454 Nonspecific intraventricular block: Secondary | ICD-10-CM | POA: Diagnosis present

## 2021-04-30 HISTORY — PX: LYMPH NODE BIOPSY: SHX201

## 2021-04-30 HISTORY — DX: Hyperlipidemia, unspecified: E78.5

## 2021-04-30 HISTORY — PX: VIDEO BRONCHOSCOPY WITH RADIAL ENDOBRONCHIAL ULTRASOUND: SHX6849

## 2021-04-30 HISTORY — PX: VIDEO BRONCHOSCOPY WITH ENDOBRONCHIAL NAVIGATION: SHX6175

## 2021-04-30 HISTORY — DX: Dyspnea, unspecified: R06.00

## 2021-04-30 HISTORY — PX: BRONCHIAL NEEDLE ASPIRATION BIOPSY: SHX5106

## 2021-04-30 HISTORY — DX: Depression, unspecified: F32.A

## 2021-04-30 HISTORY — PX: BRONCHIAL BRUSHINGS: SHX5108

## 2021-04-30 HISTORY — PX: INTERCOSTAL NERVE BLOCK: SHX5021

## 2021-04-30 HISTORY — PX: FIDUCIAL MARKER PLACEMENT: SHX6858

## 2021-04-30 LAB — CBC
HCT: 26.1 % — ABNORMAL LOW (ref 39.0–52.0)
Hemoglobin: 8.8 g/dL — ABNORMAL LOW (ref 13.0–17.0)
MCH: 32.6 pg (ref 26.0–34.0)
MCHC: 33.7 g/dL (ref 30.0–36.0)
MCV: 96.7 fL (ref 80.0–100.0)
Platelets: 180 10*3/uL (ref 150–400)
RBC: 2.7 MIL/uL — ABNORMAL LOW (ref 4.22–5.81)
RDW: 13.3 % (ref 11.5–15.5)
WBC: 12.6 10*3/uL — ABNORMAL HIGH (ref 4.0–10.5)
nRBC: 0 % (ref 0.0–0.2)

## 2021-04-30 LAB — GLUCOSE, CAPILLARY: Glucose-Capillary: 170 mg/dL — ABNORMAL HIGH (ref 70–99)

## 2021-04-30 LAB — PREPARE RBC (CROSSMATCH)

## 2021-04-30 LAB — ABO/RH: ABO/RH(D): O POS

## 2021-04-30 SURGERY — LOBECTOMY, LUNG, ROBOT-ASSISTED, USING VATS
Anesthesia: General | Site: Chest | Laterality: Left

## 2021-04-30 SURGERY — VIDEO BRONCHOSCOPY WITH ENDOBRONCHIAL NAVIGATION
Anesthesia: General | Laterality: Left

## 2021-04-30 MED ORDER — BUPIVACAINE LIPOSOME 1.3 % IJ SUSP
INTRAMUSCULAR | Status: AC
  Filled 2021-04-30: qty 20

## 2021-04-30 MED ORDER — ORAL CARE MOUTH RINSE: 15.0000 mL | Freq: Once | OROMUCOSAL | Status: AC

## 2021-04-30 MED ORDER — TRAMADOL HCL 50 MG PO TABS
50.0000 mg | ORAL_TABLET | Freq: Four times a day (QID) | ORAL | Status: DC | PRN
  Administered 2021-04-30 – 2021-05-16 (×31): 50 mg via ORAL
  Filled 2021-04-30 (×32): qty 1

## 2021-04-30 MED ORDER — ONDANSETRON HCL 4 MG/2ML IJ SOLN
INTRAMUSCULAR | Status: DC | PRN
  Administered 2021-04-30: 4 mg via INTRAVENOUS

## 2021-04-30 MED ORDER — PHENYLEPHRINE HCL-NACL 20-0.9 MG/250ML-% IV SOLN
INTRAVENOUS | Status: DC | PRN
Start: 2021-04-30 — End: 2021-04-30
  Administered 2021-04-30: 25 ug/min via INTRAVENOUS

## 2021-04-30 MED ORDER — FENTANYL CITRATE (PF) 100 MCG/2ML IJ SOLN
INTRAMUSCULAR | Status: AC
  Filled 2021-04-30: qty 2

## 2021-04-30 MED ORDER — SODIUM CHLORIDE FLUSH 0.9 % IV SOLN
INTRAVENOUS | Status: DC | PRN
  Administered 2021-04-30: 100 mL

## 2021-04-30 MED ORDER — LIDOCAINE 2% (20 MG/ML) 5 ML SYRINGE
INTRAMUSCULAR | Status: DC | PRN
  Administered 2021-04-30: 60 mg via INTRAVENOUS

## 2021-04-30 MED ORDER — PROPOFOL 10 MG/ML IV BOLUS
INTRAVENOUS | Status: DC | PRN
  Administered 2021-04-30: 100 mg via INTRAVENOUS
  Administered 2021-04-30: 50 mg via INTRAVENOUS
  Administered 2021-04-30: 20 mg via INTRAVENOUS

## 2021-04-30 MED ORDER — BISACODYL 5 MG PO TBEC
10.0000 mg | DELAYED_RELEASE_TABLET | Freq: Every day | ORAL | Status: DC
  Administered 2021-05-02 – 2021-05-18 (×15): 10 mg via ORAL
  Filled 2021-04-30 (×17): qty 2

## 2021-04-30 MED ORDER — SODIUM CHLORIDE 0.9 % IV SOLN: INTRAVENOUS | Status: DC | PRN

## 2021-04-30 MED ORDER — ACETAMINOPHEN 10 MG/ML IV SOLN
1000.0000 mg | Freq: Once | INTRAVENOUS | Status: DC | PRN
  Administered 2021-04-30: 1000 mg via INTRAVENOUS

## 2021-04-30 MED ORDER — ACETAMINOPHEN 500 MG PO TABS
1000.0000 mg | ORAL_TABLET | Freq: Four times a day (QID) | ORAL | Status: AC
  Administered 2021-04-30 – 2021-05-05 (×19): 1000 mg via ORAL
  Filled 2021-04-30 (×19): qty 2

## 2021-04-30 MED ORDER — OXYMETAZOLINE HCL 0.05 % NA SOLN
1.0000 | Freq: Two times a day (BID) | NASAL | Status: AC | PRN
  Administered 2021-05-03: 1 via NASAL
  Filled 2021-04-30: qty 30

## 2021-04-30 MED ORDER — ATORVASTATIN CALCIUM 40 MG PO TABS
40.0000 mg | ORAL_TABLET | Freq: Every evening | ORAL | Status: DC
  Administered 2021-04-30 – 2021-05-19 (×20): 40 mg via ORAL
  Filled 2021-04-30 (×20): qty 1

## 2021-04-30 MED ORDER — CHLORHEXIDINE GLUCONATE 0.12 % MT SOLN: 15.0000 mL | Freq: Once | OROMUCOSAL | Status: AC

## 2021-04-30 MED ORDER — CEFAZOLIN SODIUM 1 G IJ SOLR
INTRAMUSCULAR | Status: AC
  Filled 2021-04-30: qty 20

## 2021-04-30 MED ORDER — LACTATED RINGERS IV SOLN: INTRAVENOUS | Status: DC

## 2021-04-30 MED ORDER — PHENYLEPHRINE 40 MCG/ML (10ML) SYRINGE FOR IV PUSH (FOR BLOOD PRESSURE SUPPORT)
PREFILLED_SYRINGE | INTRAVENOUS | Status: DC | PRN
  Administered 2021-04-30 (×3): 80 ug via INTRAVENOUS
  Administered 2021-04-30 (×3): 40 ug via INTRAVENOUS
  Administered 2021-04-30: 80 ug via INTRAVENOUS

## 2021-04-30 MED ORDER — BUPIVACAINE HCL (PF) 0.5 % IJ SOLN
INTRAMUSCULAR | Status: AC
  Filled 2021-04-30: qty 30

## 2021-04-30 MED ORDER — ROCURONIUM BROMIDE 10 MG/ML (PF) SYRINGE
PREFILLED_SYRINGE | INTRAVENOUS | Status: AC
  Filled 2021-04-30: qty 10

## 2021-04-30 MED ORDER — CEFAZOLIN SODIUM-DEXTROSE 2-4 GM/100ML-% IV SOLN
2.0000 g | Freq: Three times a day (TID) | INTRAVENOUS | Status: AC
  Administered 2021-04-30 (×2): 2 g via INTRAVENOUS
  Filled 2021-04-30 (×2): qty 100

## 2021-04-30 MED ORDER — FENTANYL CITRATE (PF) 250 MCG/5ML IJ SOLN
INTRAMUSCULAR | Status: DC | PRN
  Administered 2021-04-30: 25 ug via INTRAVENOUS
  Administered 2021-04-30: 50 ug via INTRAVENOUS
  Administered 2021-04-30: 100 ug via INTRAVENOUS
  Administered 2021-04-30: 50 ug via INTRAVENOUS
  Administered 2021-04-30: 25 ug via INTRAVENOUS

## 2021-04-30 MED ORDER — SUGAMMADEX SODIUM 200 MG/2ML IV SOLN
INTRAVENOUS | Status: DC | PRN
  Administered 2021-04-30: 200 mg via INTRAVENOUS

## 2021-04-30 MED ORDER — ROCURONIUM BROMIDE 10 MG/ML (PF) SYRINGE
PREFILLED_SYRINGE | INTRAVENOUS | Status: DC | PRN
  Administered 2021-04-30: 20 mg via INTRAVENOUS
  Administered 2021-04-30: 50 mg via INTRAVENOUS
  Administered 2021-04-30: 40 mg via INTRAVENOUS
  Administered 2021-04-30: 60 mg via INTRAVENOUS

## 2021-04-30 MED ORDER — KETOROLAC TROMETHAMINE 15 MG/ML IJ SOLN
15.0000 mg | Freq: Four times a day (QID) | INTRAMUSCULAR | Status: AC | PRN
  Administered 2021-04-30 – 2021-05-04 (×13): 15 mg via INTRAVENOUS
  Filled 2021-04-30 (×13): qty 1

## 2021-04-30 MED ORDER — 0.9 % SODIUM CHLORIDE (POUR BTL) OPTIME
TOPICAL | Status: DC | PRN
  Administered 2021-04-30: 2000 mL

## 2021-04-30 MED ORDER — ACETAMINOPHEN 10 MG/ML IV SOLN
INTRAVENOUS | Status: AC
  Filled 2021-04-30: qty 100

## 2021-04-30 MED ORDER — METHYLENE BLUE 0.5 % INJ SOLN
INTRAVENOUS | Status: DC | PRN
  Administered 2021-04-30: 1.5 mL

## 2021-04-30 MED ORDER — DEXAMETHASONE SODIUM PHOSPHATE 10 MG/ML IJ SOLN
INTRAMUSCULAR | Status: DC | PRN
  Administered 2021-04-30: 10 mg via INTRAVENOUS

## 2021-04-30 MED ORDER — PROPOFOL 10 MG/ML IV BOLUS
INTRAVENOUS | Status: AC
  Filled 2021-04-30: qty 20

## 2021-04-30 MED ORDER — SENNOSIDES-DOCUSATE SODIUM 8.6-50 MG PO TABS
1.0000 | ORAL_TABLET | Freq: Every day | ORAL | Status: DC
  Administered 2021-05-02 – 2021-05-18 (×13): 1 via ORAL
  Filled 2021-04-30 (×15): qty 1

## 2021-04-30 MED ORDER — SODIUM CHLORIDE 0.9% IV SOLUTION: Freq: Once | INTRAVENOUS | Status: AC

## 2021-04-30 MED ORDER — FENTANYL CITRATE (PF) 250 MCG/5ML IJ SOLN
INTRAMUSCULAR | Status: AC
  Filled 2021-04-30: qty 5

## 2021-04-30 MED ORDER — ACETAMINOPHEN 160 MG/5ML PO SOLN: 1000.0000 mg | Freq: Four times a day (QID) | ORAL | Status: AC

## 2021-04-30 MED ORDER — EPHEDRINE SULFATE-NACL 50-0.9 MG/10ML-% IV SOSY
PREFILLED_SYRINGE | INTRAVENOUS | Status: DC | PRN
  Administered 2021-04-30 (×3): 5 mg via INTRAVENOUS
  Administered 2021-04-30: 15 mg via INTRAVENOUS

## 2021-04-30 MED ORDER — CEFAZOLIN SODIUM-DEXTROSE 2-4 GM/100ML-% IV SOLN
INTRAVENOUS | Status: AC
  Filled 2021-04-30: qty 100

## 2021-04-30 MED ORDER — FENTANYL CITRATE (PF) 100 MCG/2ML IJ SOLN
25.0000 ug | INTRAMUSCULAR | Status: DC | PRN
  Administered 2021-04-30 (×3): 25 ug via INTRAVENOUS

## 2021-04-30 MED ORDER — CHLORHEXIDINE GLUCONATE 0.12 % MT SOLN
OROMUCOSAL | Status: AC
  Administered 2021-04-30: 15 mL via OROMUCOSAL
  Filled 2021-04-30: qty 15

## 2021-04-30 MED ORDER — ASPIRIN EC 325 MG PO TBEC
325.0000 mg | DELAYED_RELEASE_TABLET | Freq: Every day | ORAL | Status: DC
  Administered 2021-05-02 – 2021-05-20 (×19): 325 mg via ORAL
  Filled 2021-04-30 (×19): qty 1

## 2021-04-30 MED ORDER — LACTATED RINGERS IV SOLN: INTRAVENOUS | Status: DC | PRN

## 2021-04-30 MED ORDER — SODIUM CHLORIDE 0.9 % IV BOLUS
500.0000 mL | Freq: Once | INTRAVENOUS | Status: AC
  Administered 2021-04-30: 500 mL via INTRAVENOUS

## 2021-04-30 MED ORDER — CEFAZOLIN SODIUM-DEXTROSE 2-4 GM/100ML-% IV SOLN
2.0000 g | INTRAVENOUS | Status: AC
  Administered 2021-04-30 (×2): 2 g via INTRAVENOUS

## 2021-04-30 MED ORDER — ONDANSETRON HCL 4 MG/2ML IJ SOLN: 4.0000 mg | Freq: Once | INTRAMUSCULAR | Status: DC | PRN

## 2021-04-30 MED ORDER — ONDANSETRON HCL 4 MG/2ML IJ SOLN
4.0000 mg | Freq: Four times a day (QID) | INTRAMUSCULAR | Status: DC | PRN
  Administered 2021-05-01: 4 mg via INTRAVENOUS
  Filled 2021-04-30: qty 2

## 2021-04-30 SURGICAL SUPPLY — 46 items

## 2021-04-30 SURGICAL SUPPLY — 113 items
ADH SKN CLS APL DERMABOND .7 (GAUZE/BANDAGES/DRESSINGS) ×1
APL PRP STRL LF DISP 70% ISPRP (MISCELLANEOUS) ×1
APPLIER CLIP 5 13 M/L LIGAMAX5 (MISCELLANEOUS) ×2
APR CLP MED LRG 5 ANG JAW (MISCELLANEOUS) ×1
BAG SPEC RTRVL C1550 15 (MISCELLANEOUS) ×1
BLADE CLIPPER SURG (BLADE) ×1 IMPLANT
BLADE SURG 11 STRL SS (BLADE) ×2 IMPLANT
BNDG COHESIVE 6X5 TAN STRL LF (GAUZE/BANDAGES/DRESSINGS) ×2 IMPLANT
CANISTER SUCT 3000ML PPV (MISCELLANEOUS) ×4 IMPLANT
CANNULA REDUC XI 12-8 STAPL (CANNULA) ×4
CANNULA REDUCER 12-8 DVNC XI (CANNULA) ×2 IMPLANT
CATH THORACIC 28FR (CATHETERS) ×1 IMPLANT
CATH TROCAR 20FR (CATHETERS) IMPLANT
CHLORAPREP W/TINT 26 (MISCELLANEOUS) ×2 IMPLANT
CLIP APPLIE 5 13 M/L LIGAMAX5 (MISCELLANEOUS) IMPLANT
CLIP VESOCCLUDE MED 6/CT (CLIP) IMPLANT
CNTNR URN SCR LID CUP LEK RST (MISCELLANEOUS) ×5 IMPLANT
CONN ST 1/4X3/8  BEN (MISCELLANEOUS)
CONN ST 1/4X3/8 BEN (MISCELLANEOUS) IMPLANT
CONT SPEC 4OZ STRL OR WHT (MISCELLANEOUS) ×22
DEFOGGER SCOPE WARMER CLEARIFY (MISCELLANEOUS) ×2 IMPLANT
DERMABOND ADVANCED (GAUZE/BANDAGES/DRESSINGS) ×1
DERMABOND ADVANCED .7 DNX12 (GAUZE/BANDAGES/DRESSINGS) ×1 IMPLANT
DISSECTOR BLUNT TIP ENDO 5MM (MISCELLANEOUS) IMPLANT
DRAIN CHANNEL 28F RND 3/8 FF (WOUND CARE) IMPLANT
DRAPE ARM DVNC X/XI (DISPOSABLE) ×4 IMPLANT
DRAPE COLUMN DVNC XI (DISPOSABLE) ×1 IMPLANT
DRAPE CV SPLIT W-CLR ANES SCRN (DRAPES) ×2 IMPLANT
DRAPE DA VINCI XI ARM (DISPOSABLE) ×8
DRAPE DA VINCI XI COLUMN (DISPOSABLE) ×2
DRAPE ORTHO SPLIT 77X108 STRL (DRAPES) ×2
DRAPE SURG ORHT 6 SPLT 77X108 (DRAPES) ×1 IMPLANT
ELECT BLADE 6.5 EXT (BLADE) IMPLANT
ELECT REM PT RETURN 9FT ADLT (ELECTROSURGICAL) ×2
ELECTRODE REM PT RTRN 9FT ADLT (ELECTROSURGICAL) ×1 IMPLANT
GAUZE KITTNER 4X8 (MISCELLANEOUS) ×3 IMPLANT
GAUZE SPONGE 4X4 12PLY STRL (GAUZE/BANDAGES/DRESSINGS) ×2 IMPLANT
GLOVE SURG ENC MOIS LTX SZ7.5 (GLOVE) ×4 IMPLANT
GLOVE SURG POLYISO LF SZ8 (GLOVE) ×2 IMPLANT
GOWN STRL REUS W/ TWL LRG LVL3 (GOWN DISPOSABLE) ×2 IMPLANT
GOWN STRL REUS W/ TWL XL LVL3 (GOWN DISPOSABLE) ×3 IMPLANT
GOWN STRL REUS W/TWL 2XL LVL3 (GOWN DISPOSABLE) ×2 IMPLANT
GOWN STRL REUS W/TWL LRG LVL3 (GOWN DISPOSABLE) ×4
GOWN STRL REUS W/TWL XL LVL3 (GOWN DISPOSABLE) ×6
HEMOSTAT SURGICEL 2X14 (HEMOSTASIS) ×6 IMPLANT
IRRIGATION STRYKERFLOW (MISCELLANEOUS) IMPLANT
IRRIGATOR STRYKERFLOW (MISCELLANEOUS)
KIT BASIN OR (CUSTOM PROCEDURE TRAY) ×2 IMPLANT
KIT TURNOVER KIT B (KITS) ×2 IMPLANT
NEEDLE 22X1 1/2 (OR ONLY) (NEEDLE) ×2 IMPLANT
NS IRRIG 1000ML POUR BTL (IV SOLUTION) ×6 IMPLANT
PACK CHEST (CUSTOM PROCEDURE TRAY) ×2 IMPLANT
PAD ARMBOARD 7.5X6 YLW CONV (MISCELLANEOUS) ×10 IMPLANT
PORT ACCESS TROCAR AIRSEAL 12 (TROCAR) ×1 IMPLANT
PORT ACCESS TROCAR AIRSEAL 5M (TROCAR) ×1
POUCH ENDO CATCH II 15MM (MISCELLANEOUS) IMPLANT
RELOAD STAPLE 45 2.5 WHT DVNC (STAPLE) IMPLANT
RELOAD STAPLE 45 3.5 BLU DVNC (STAPLE) IMPLANT
RELOAD STAPLE 45 4.3 GRN DVNC (STAPLE) IMPLANT
RELOAD STAPLER 2.5X45 WHT DVNC (STAPLE) ×3 IMPLANT
RELOAD STAPLER 3.5X45 BLU DVNC (STAPLE) ×6 IMPLANT
RELOAD STAPLER 4.3X45 GRN DVNC (STAPLE) ×3 IMPLANT
RETRACTOR WOUND ALXS 19CM XSML (INSTRUMENTS) IMPLANT
RTRCTR WOUND ALEXIS 19CM XSML (INSTRUMENTS)
SCISSORS LAP 5X35 DISP (ENDOMECHANICALS) IMPLANT
SEAL CANN UNIV 5-8 DVNC XI (MISCELLANEOUS) ×2 IMPLANT
SEAL XI 5MM-8MM UNIVERSAL (MISCELLANEOUS) ×4
SEALANT PROGEL (MISCELLANEOUS) IMPLANT
SEALER LIGASURE MARYLAND 30 (ELECTROSURGICAL) IMPLANT
SET TRI-LUMEN FLTR TB AIRSEAL (TUBING) ×2 IMPLANT
SOLUTION ELECTROLUBE (MISCELLANEOUS) IMPLANT
SPONGE INTESTINAL PEANUT (DISPOSABLE) IMPLANT
SPONGE TONSIL TAPE 1 RFD (DISPOSABLE) IMPLANT
STAPLER 45 SUREFORM CVD (STAPLE) ×2
STAPLER 45 SUREFORM CVD DVNC (STAPLE) IMPLANT
STAPLER CANNULA SEAL DVNC XI (STAPLE) ×2 IMPLANT
STAPLER CANNULA SEAL XI (STAPLE) ×4
STAPLER RELOAD 2.5X45 WHITE (STAPLE) ×6
STAPLER RELOAD 2.5X45 WHT DVNC (STAPLE) ×3
STAPLER RELOAD 3.5X45 BLU DVNC (STAPLE) ×6
STAPLER RELOAD 3.5X45 BLUE (STAPLE) ×12
STAPLER RELOAD 4.3X45 GREEN (STAPLE) ×6
STAPLER RELOAD 4.3X45 GRN DVNC (STAPLE) ×3
STOPCOCK 4 WAY LG BORE MALE ST (IV SETS) ×2 IMPLANT
SUT MNCRL AB 3-0 PS2 18 (SUTURE) IMPLANT
SUT MON AB 2-0 CT1 36 (SUTURE) IMPLANT
SUT PDS AB 1 CTX 36 (SUTURE) IMPLANT
SUT PROLENE 4 0 RB 1 (SUTURE)
SUT PROLENE 4-0 RB1 .5 CRCL 36 (SUTURE) IMPLANT
SUT SILK  1 MH (SUTURE) ×2
SUT SILK 1 MH (SUTURE) ×1 IMPLANT
SUT SILK 1 TIES 10X30 (SUTURE) IMPLANT
SUT SILK 2 0 SH (SUTURE) ×1 IMPLANT
SUT SILK 2 0SH CR/8 30 (SUTURE) IMPLANT
SUT VIC AB 1 CTX 36 (SUTURE)
SUT VIC AB 1 CTX36XBRD ANBCTR (SUTURE) IMPLANT
SUT VIC AB 2-0 CT1 27 (SUTURE) ×2
SUT VIC AB 2-0 CT1 TAPERPNT 27 (SUTURE) ×1 IMPLANT
SUT VIC AB 3-0 SH 27 (SUTURE) ×4
SUT VIC AB 3-0 SH 27X BRD (SUTURE) ×2 IMPLANT
SUT VICRYL 0 TIES 12 18 (SUTURE) ×2 IMPLANT
SUT VICRYL 0 UR6 27IN ABS (SUTURE) ×4 IMPLANT
SUT VICRYL 2 TP 1 (SUTURE) IMPLANT
SYR 10ML LL (SYRINGE) ×2 IMPLANT
SYR 20ML LL LF (SYRINGE) ×2 IMPLANT
SYR 50ML LL SCALE MARK (SYRINGE) ×2 IMPLANT
SYSTEM RETRIEVAL ANCHOR 15 (MISCELLANEOUS) ×1 IMPLANT
SYSTEM SAHARA CHEST DRAIN ATS (WOUND CARE) ×2 IMPLANT
TAPE CLOTH 4X10 WHT NS (GAUZE/BANDAGES/DRESSINGS) ×2 IMPLANT
TIP APPLICATOR SPRAY EXTEND 16 (VASCULAR PRODUCTS) IMPLANT
TOWEL GREEN STERILE (TOWEL DISPOSABLE) ×2 IMPLANT
TRAY FOLEY MTR SLVR 16FR STAT (SET/KITS/TRAYS/PACK) ×2 IMPLANT
TUBING EXTENTION W/L.L. (IV SETS) ×2 IMPLANT

## 2021-04-30 NOTE — Anesthesia Procedure Notes (Signed)
Arterial Line Insertion Start/End8/12/2020 7:15 AM Performed by: CRNA  Patient location: Pre-op. Preanesthetic checklist: patient identified, IV checked, site marked, risks and benefits discussed, surgical consent, monitors and equipment checked, pre-op evaluation, timeout performed and anesthesia consent Lidocaine 1% used for infiltration Left, radial was placed Catheter size: 20 G Hand hygiene performed , maximum sterile barriers used  and Seldinger technique used  Attempts: 1 Procedure performed without using ultrasound guided technique. Following insertion, dressing applied and Biopatch. Post procedure assessment: normal and unchanged  Patient tolerated the procedure well with no immediate complications. Additional procedure comments: Dory Larsen, SRNA .

## 2021-04-30 NOTE — Progress Notes (Signed)
   04/30/21 2103  Assess: MEWS Score  Temp 97.6 F (36.4 C)  BP (!) 73/50  Pulse Rate 87  ECG Heart Rate 88  Resp 18  Level of Consciousness Alert  SpO2 96 %  O2 Device Room Air  Assess: MEWS Score  MEWS Temp 0  MEWS Systolic 2  MEWS Pulse 0  MEWS RR 0  MEWS LOC 0  MEWS Score 2  MEWS Score Color Yellow  Assess: if the MEWS score is Yellow or Red  Were vital signs taken at a resting state? Yes  Focused Assessment No change from prior assessment  Early Detection of Sepsis Score *See Row Information* Low  MEWS guidelines implemented *See Row Information* Yes  Take Vital Signs  Increase Vital Sign Frequency  Yellow: Q 2hr X 2 then Q 4hr X 2, if remains yellow, continue Q 4hrs  Escalate  MEWS: Escalate Yellow: discuss with charge nurse/RN and consider discussing with provider and RRT  Notify: Charge Nurse/RN  Name of Charge Nurse/RN Notified Silvestre Mesi, RN  Date Charge Nurse/RN Notified 04/30/21  Time Charge Nurse/RN Notified 2103  Notify: Provider  Provider Name/Title Dr. Roxan Hockey  Date Provider Notified 04/30/21  Time Provider Notified 2104  Notification Type Page  Notification Reason Change in status  Provider response See new orders;Evaluate remotely  Date of Provider Response 04/30/21  Time of Provider Response 2106  Document  Patient Outcome Not stable and remains on department  Progress note created (see row info) Yes

## 2021-04-30 NOTE — Progress Notes (Addendum)
10 minutes into blood administration, patient c/o nausea.  Patient's HR dropped to 30-40s and patient went briefly unresponsive. Blood immediately stopped. Within a minute, patient came back to.  Alert and oriented to baseline.  HR came back to 70s.  Rapid Response called to bedside.  CBG WNL. Called Blood Bank to see if this was considered a reaction, they do not believe so as temp remained the same and BP did not fluctuate much.  Blood restarted.   Update 0205 - Pt tolerated remainder of transfusion without incident.

## 2021-04-30 NOTE — Interval H&P Note (Signed)
History and Physical Interval Note:  04/30/2021 7:28 AM  Kurt Bolton  has presented today for surgery, with the diagnosis of PULMONARY NODULE.  The various methods of treatment have been discussed with the patient and family. After consideration of risks, benefits and other options for treatment, the patient has consented to  Procedure(s): XI ROBOTIC ASSISTED THORASCOPY-LEFT UPPER LOBECTOMY (Left) as a surgical intervention.  The patient's history has been reviewed, patient examined, no change in status, stable for surgery.  I have reviewed the patient's chart and labs.  Questions were answered to the patient's satisfaction.     Alaney Witter [name]

## 2021-04-30 NOTE — Op Note (Signed)
[address]       [place] [address]             [phone]        04/30/2021  Patient:  Kurt Bolton Pre-Op Dx: Left upper lobe pulmonary nodule Post-op Dx: Left upper lobe carcinoma Procedure: - Robotic assisted left video thoracoscopy -Left upper lobectomy -Apical pleurectomy - Mediastinal lymph node sampling - Intercostal nerve block  Surgeon and Role:      * Jajaira Ruis, Lucile Crater, MD - Primary    *M. Roddenberry, PA-C- assisting  Anesthesia  general EBL: 150 ml Blood Administration: None Specimen: Left upper lobe.  Apical pleura.  5, 9, hilar lymph nodes.  Drains: 25 F argyle chest tube in left chest Counts: correct   Indications: 75 year old male with a 1.9 cm left upper lobe with an SUV uptake of 2.1.  He has a significant smoking history and was identified in our lung cancer screening program.  He was deemed a good candidate for a combined a robotic bronchoscopy with biopsy and marking followed by left upper lobectomy performed robotically as well.  Findings: There were significant adhesions to the apex of the left upper lobe.  It did appear as though the pulmonary nodule was growing into the apex of the chest wall.  After getting the lobe down and performing a lobectomy the pleura was resected at the point of attachment, but there were some areas that were deeply invested.  This likely was due to chest wall involvement.  The area was then marked with clips given the strong likelihood that he will need adjuvant radiation.  Operative Technique: After the risks, benefits and alternatives were thoroughly discussed, the patient was brought to the operative theatre.  Anesthesia was induced, and the patient was then placed in a right lateral decubitus position and was prepped and draped in normal sterile fashion.  An appropriate surgical pause was performed, and pre-operative antibiotics were dosed accordingly.  We began by placing our 4 robotic  ports in the the 7th intercostal space targeting the hilum of the lung.  A 27mm assistant port was placed in the 9th intercostal space in the anterior axillary line.  The robot was then docked and all instruments were passed under direct visualization.    The lung was then retracted superiorly, and the inferior pulmonary ligament was divided.  As we worked our way up to the apex of the left upper lobe multiple adhesions were taken down.  The nodule was deeply invested into the parietal pleura.  Great care was used to take this down but there is a good chance that there was some residual tumor left in the apex.  I then performed an apical pleurectomy to resect this.    The hilum was mobilized anteriorly and posteriorly.  We identified the upper lobe pulmonary vein, and after careful isolation, it was divided with a vascular stapler.  We next moved to the pulmonary artery truncal branches.  The artery was then divided with a vascular load stapler.  The bronchus to the upper lobe was then isolated.  After a test clamp, with good ventilation of the lower lobe, the bronchus was then divided.  The fissure was completed, and the specimen was passed into an endocatch bag.  It was removed from the anterior access site.    Lymph nodes were then sampled at levels 5, 9, and hilum.  The chest was irrigated, and an air leak test was performed.  An intercostal nerve block was performed under direct visualization.  A 28 F chest with then placed, and we watch the remaining lobes re-expand.  The skin and soft tissue were closed with absorbable suture    The patient tolerated the procedure without any immediate complications, and was transferred to the PACU in stable condition.  Kurt Bolton Kurt Bolton

## 2021-04-30 NOTE — Op Note (Addendum)
Video Bronchoscopy with Robotic Assisted Bronchoscopic Navigation   Date of Operation: 04/30/2021  Pre-op Diagnosis: Left upper lobe nodule  Post-op Diagnosis: Left upper lobe nodule  Surgeon: Garner Nash, DO  Assistants: None  Anesthesia: General endotracheal anesthesia  Operation: Flexible video fiberoptic bronchoscopy with robotic assistance and biopsies.  Estimated Blood Loss: Minimal  Complications: None  Indications and History: Kurt Bolton is a 75 y.o. male with history of left upper lobe nodule. The risks, benefits, complications, treatment options and expected outcomes were discussed with the patient.  The possibilities of pneumothorax, pneumonia, reaction to medication, pulmonary aspiration, perforation of a viscus, bleeding, failure to diagnose a condition and creating a complication requiring transfusion or operation were discussed with the patient who freely signed the consent.    Description of Procedure: The patient was seen in the Preoperative Area, was examined and was deemed appropriate to proceed.  The patient was taken to [facility] endoscopy room 3, identified as Kurt Bolton and the procedure verified as Flexible Video Fiberoptic Bronchoscopy.  A Time Out was held and the above information confirmed.   Prior to the date of the procedure a high-resolution CT scan of the chest was performed. Utilizing ION software program a virtual tracheobronchial tree was generated to allow the creation of distinct navigation pathways to the patient's parenchymal abnormalities. After being taken to the operating room general anesthesia was initiated and the patient  was orally intubated. The video fiberoptic bronchoscope was introduced via the endotracheal tube and a general inspection was performed which showed normal right and left lung anatomy, aspiration of the bilateral mainstems was completed to remove any remaining secretions. Robotic catheter inserted into patient's endotracheal  tube.   Target #1 left upper lobe nodule: The distinct navigation pathways prepared prior to this procedure were then utilized to navigate to patient's lesion identified on CT scan. The robotic catheter was secured into place and the vision probe was withdrawn.  Lesion location was approximated using fluoroscopy and radial endobronchial ultrasound for peripheral targeting. Under fluoroscopic guidance transbronchial needle brushings, transbronchial needle biopsies, were performed to be sent for cytology and pathology.  Following tissue sampling a 50% / 50% mixture of ICG plus methylene blue was injected into the lesion using the transbronchial needle that was pretty prominent under fluoroscopy for fiducial dye marking.  At the end of the procedure a general airway inspection was performed and there was no evidence of active bleeding. The bronchoscope was removed.  The patient tolerated the procedure well. There was no significant blood loss and there were no obvious complications. A post-procedural chest x-ray is pending.  Samples Target #1: 1. Transbronchial needle brushings from left upper lobe 2. Transbronchial Wang needle biopsies from left upper lobe  Plans:  The patient was transferred under the care of anesthesia and Dr. Kipp Brood to the operating room 10 for robotic assisted lobectomy.  Garner Nash, DO [facility] 04/30/2021 8:53 AM

## 2021-04-30 NOTE — Anesthesia Procedure Notes (Signed)
Procedure Name: Intubation Date/Time: 04/30/2021 8:52 AM Performed by: Darral Dash, DO Pre-anesthesia Checklist: Patient identified, Emergency Drugs available, Suction available, Patient being monitored and Timeout performed Patient Re-evaluated:Patient Re-evaluated prior to induction Oxygen Delivery Method: Circle system utilized Preoxygenation: Pre-oxygenation with 100% oxygen Induction Type: IV induction and Inhalational induction Laryngoscope Size: Mac and 4 Grade View: Grade I Tube type: Oral Endobronchial tube: Left, Double lumen EBT, EBT position confirmed by auscultation and EBT position confirmed by fiberoptic bronchoscope and 39 Fr Number of attempts: 1 Airway Equipment and Method: Stylet Placement Confirmation: ETT inserted through vocal cords under direct vision, positive ETCO2 and breath sounds checked- equal and bilateral Tube secured with: Tape Dental Injury: Teeth and Oropharynx as per pre-operative assessment

## 2021-04-30 NOTE — Progress Notes (Addendum)
Paged Dr. Roxan Hockey regarding BP 73/50. Chest tube had total 656mL in [place].  Also notified of SQ air in left lateral chest and back. Ordered 561mL NS bolus and CBC.  Upon completion of bolus, BP increased to 82/57 but while waiting results of CBC, had dropped back to 79/54.  Paged Dr. Roxan Hockey again, Hgb had dropped from 13.2 at 1400 to 8.8 at 2205.  1 unit PRBC ordered. Patient remains alert and oriented per baseline.  Denies any symptoms.  Will continue to monitor.

## 2021-04-30 NOTE — Brief Op Note (Signed)
04/30/2021  12:40 PM  PATIENT:  Kurt Bolton  75 y.o. male  PRE-OPERATIVE DIAGNOSIS:  LEFT UPPER LOBE PULMONARY NODULE  POST-OPERATIVE DIAGNOSIS:  LEFT UPPER LOBE PULMONARY NODULE WITH APICAL CHEST WALL INVASION  PROCEDURE:   --XI ROBOTIC ASSISTED THORASCOPY-LEFT UPPER LOBECTOMY  WITH RESECTION OF APICAL PLEURA --INTERCOSTAL NERVE BLOCK (Left) --LYMPH NODE BIOPSY (Left)  SURGEON:  Lajuana Matte, MD - Primary  PHYSICIAN ASSISTANT: Emmerson Taddei   ANESTHESIA:   general  EBL:  125 mL   BLOOD ADMINISTERED:none  DRAINS:  Left pleural tube    LOCAL MEDICATIONS USED:  Exparel  SPECIMEN:  left upper lung lobe, left apical pleura, multiple lymph nodes  DISPOSITION OF SPECIMEN:  PATHOLOGY  COUNTS:  YES  DICTATION: .Dragon Dictation  PLAN OF CARE: Admit to inpatient   PATIENT DISPOSITION:  PACU - hemodynamically stable.   Delay start of Pharmacological VTE agent (>24hrs) due to surgical blood loss or risk of bleeding: yes

## 2021-04-30 NOTE — Interval H&P Note (Signed)
History and Physical Interval Note:  04/30/2021 7:33 AM  Kurt Bolton  has presented today for surgery, with the diagnosis of lung nodule.  The various methods of treatment have been discussed with the patient and family. After consideration of risks, benefits and other options for treatment, the patient has consented to  Procedure(s) with comments: [place] (Left) - ION, w/ FIducial Dye Marking (MB:ICG) as a surgical intervention.  The patient's history has been reviewed, patient examined, no change in status, stable for surgery.  I have reviewed the patient's chart and labs.  Questions were answered to the patient's satisfaction.     [place]

## 2021-04-30 NOTE — Hospital Course (Addendum)
History of Present Illness:    Kurt Bolton 75 y.o. male presents for surgical evaluation of a spiculated left upper lobe pulmonary nodule.  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  He continues to smoke but has cut down to half a pack a day.  He was in the lung cancer screening program through the [place], and was noted to have a suspicious nodule that was followed.  He subsequently underwent a PET/CT which showed avidity within this lesion.  He was then referred to cardiothoracic surgery for further evaluation.   The patient denies any weight changes, and has maintained her weight between 130 and 135 pounds for several years.  He denies any neurologic symptoms.  He does complain of groin pain but this is due to a recurrence in his inguinal hernia.  When he exerts himself he does have some shortness of breath, but denies any chest pain.     Smoking Hx: Continues to smoke half a pack a day.  Options for managing suspected primary lung cancer were discussed with Kurt Bolton by Kurt Bolton and the decision was made to proceed with robotic-assisted left upper lobectomy. Pre-operative workup included PFTS that showed sufficient reserve function permit the proposed resection.  A myocardial perfusion study at rest and during stress was normal.   Hospital Course:  Kurt Bolton was admitted to the hospital for same-day surgery on 04/30/2021.  After usual preoperative preparation, he was taken to the endoscopy suite where navigational bronchoscopy with biopsy was carried out by Dr. Valeta Harms.  This confirmed carcinoma.  Patient was transferred to the operating room where we are proceeded with left robotic assisted upper lobectomy with lymph node sampling.  The tumor was invasive to the chest wall in the left apex.  Apical pleurectomy was performed at that time.  Following the procedure, patient was transferred to the postanesthesia care unit where he remained hemodynamically stable.  He was taken  back to the OR on 8/5 for an exploration and drainage of hemothorax, control of bleeding.  He required transfusion of 3 units of packed red blood cells to replace perioperative losses.  Following this procedure, he was transferred to the ICU.  He remained hemodynamically stable and had gradual tapering of the chest tube drainage.  He had gradually improving aeration of the residual left lung.  By the second postoperative day, he was transferred back to [facility] progressive care. POD 3 he continued to progress. He was encouraged to ambulate in the halls. He still had an air leak so his chest tube was kept in place. He placed him back to suction since his left pneumothorax increased. His CXR improved the following day therefore we were able to switch him back to water seal. We continued serial CXR to closely monitor his pneumothorax. His air leak continued but his CXR remained stable. There was no air leak noted on 08/14. Patient had complaints of constipation, abdominal cramping. He was given laxatives. No air leak was seen on 8/15 and CXR remained stable. Therefore, chest tube was clamped around 8am. CXR at noon showed Stable position of left chest tube. Previously noted barely perceptible left apical pneumothorax is difficult to visualize on the current exam. Follow-up CXR showed no pneumothorax s.p left-sided chest tube removal. Today, he is stable to be transferred to SNF when bed is available. He was having some nicotine cravings therefore we gave him a nicotine patch. He has had a COVID test ordered which was negative. He  remains stable and is ready for SNF transfer.

## 2021-04-30 NOTE — Discharge Instructions (Addendum)
Discharge Instructions:  1. You may shower, please wash incisions daily with soap and water and keep dry.  If you wish to cover wounds with dressing you may do so but please keep clean and change daily.  No tub baths or swimming until incisions have completely healed.  If your incisions become red or develop any drainage please call our office at [phone]  2. No Driving until cleared by Dr. Abran Duke office and you are no longer using narcotic pain medications  3. Fever of 101.5 for at least 24 hours with no source, please contact our office at [phone]  4. Activity- up as tolerated, please walk at least 3 times per day.  Avoid strenuous activity  5. If any questions or concerns arise, please do not hesitate to contact our office at [phone]

## 2021-04-30 NOTE — Transfer of Care (Signed)
Immediate Anesthesia Transfer of Care Note  Patient: Kurt Bolton  Procedure(s) Performed: VIDEO BRONCHOSCOPY WITH ENDOBRONCHIAL NAVIGATION (Left) BRONCHIAL BRUSHINGS VIDEO BRONCHOSCOPY WITH RADIAL ENDOBRONCHIAL ULTRASOUND BRONCHIAL NEEDLE ASPIRATION BIOPSIES FIDUCIAL DYE MARKING XI ROBOTIC ASSISTED THORASCOPY-LEFT UPPER LOBECTOMY (Left: Chest) INTERCOSTAL NERVE BLOCK (Left: Chest) LYMPH NODE BIOPSY (Left: Chest)  Patient Location: PACU  Anesthesia Type:General  Level of Consciousness: awake and drowsy  Airway & Oxygen Therapy: Patient Spontanous Breathing  Post-op Assessment: Report given to RN, Post -op Vital signs reviewed and stable and Patient moving all extremities  Post vital signs: Reviewed and stable  Last Vitals:  Vitals Value Taken Time  BP 124/63 04/30/21 1322  Temp    Pulse 89 04/30/21 1327  Resp 20 04/30/21 1327  SpO2 95 % 04/30/21 1327  Vitals shown include unvalidated device data.  Last Pain:  Vitals:   04/30/21 0635  TempSrc:   PainSc: 0-No pain         Complications: No notable events documented.

## 2021-04-30 NOTE — Anesthesia Procedure Notes (Signed)
Procedure Name: Intubation Date/Time: 04/30/2021 7:41 AM Performed by: Darral Dash, DO Pre-anesthesia Checklist: Patient identified, Emergency Drugs available, Suction available, Patient being monitored and Timeout performed Patient Re-evaluated:Patient Re-evaluated prior to induction Oxygen Delivery Method: Circle system utilized Preoxygenation: Pre-oxygenation with 100% oxygen Induction Type: IV induction Ventilation: Mask ventilation without difficulty Laryngoscope Size: Mac and 4 Grade View: Grade I Tube type: Oral Tube size: 8.5 mm Number of attempts: 1 Airway Equipment and Method: Stylet Placement Confirmation: ETT inserted through vocal cords under direct vision, positive ETCO2 and breath sounds checked- equal and bilateral Secured at: 23 cm Tube secured with: Tape Dental Injury: Teeth and Oropharynx as per pre-operative assessment

## 2021-05-01 ENCOUNTER — Inpatient Hospital Stay (HOSPITAL_COMMUNITY): Payer: No Typology Code available for payment source | Admitting: Certified Registered Nurse Anesthetist

## 2021-05-01 ENCOUNTER — Encounter (HOSPITAL_COMMUNITY)
Admission: AD | Disposition: A | Discharge status: Home / Self Care | Payer: Self-pay | Source: Home / Self Care | Attending: Thoracic Surgery (Cardiothoracic Vascular Surgery)

## 2021-05-01 ENCOUNTER — Inpatient Hospital Stay (HOSPITAL_COMMUNITY): Payer: No Typology Code available for payment source

## 2021-05-01 ENCOUNTER — Encounter (HOSPITAL_COMMUNITY): Payer: Self-pay | Admitting: Thoracic Surgery (Cardiothoracic Vascular Surgery)

## 2021-05-01 DIAGNOSIS — J942 Hemothorax: Secondary | ICD-10-CM

## 2021-05-01 HISTORY — PX: VIDEO ASSISTED THORACOSCOPY: SHX5073

## 2021-05-01 LAB — BLOOD GAS, ARTERIAL
Acid-Base Excess: 0.3 mmol/L (ref 0.0–2.0)
Bicarbonate: 23.9 mmol/L (ref 20.0–28.0)
FIO2: 28
O2 Saturation: 97.2 %
Patient temperature: 36.7
pCO2 arterial: 34.7 mmHg (ref 32.0–48.0)
pH, Arterial: 7.45 (ref 7.350–7.450)
pO2, Arterial: 80.8 mmHg — ABNORMAL LOW (ref 83.0–108.0)

## 2021-05-01 LAB — CBC
HCT: 23.4 % — ABNORMAL LOW (ref 39.0–52.0)
Hemoglobin: 8.2 g/dL — ABNORMAL LOW (ref 13.0–17.0)
MCH: 32.5 pg (ref 26.0–34.0)
MCHC: 35 g/dL (ref 30.0–36.0)
MCV: 92.9 fL (ref 80.0–100.0)
Platelets: 168 10*3/uL (ref 150–400)
RBC: 2.52 MIL/uL — ABNORMAL LOW (ref 4.22–5.81)
RDW: 14 % (ref 11.5–15.5)
WBC: 15.9 10*3/uL — ABNORMAL HIGH (ref 4.0–10.5)
nRBC: 0 % (ref 0.0–0.2)

## 2021-05-01 LAB — BASIC METABOLIC PANEL
Anion gap: 7 (ref 5–15)
BUN: 25 mg/dL — ABNORMAL HIGH (ref 8–23)
CO2: 23 mmol/L (ref 22–32)
Calcium: 8.1 mg/dL — ABNORMAL LOW (ref 8.9–10.3)
Chloride: 104 mmol/L (ref 98–111)
Creatinine, Ser: 1 mg/dL (ref 0.61–1.24)
GFR, Estimated: >60 mL/min (ref >60)
Glucose, Bld: 151 mg/dL — ABNORMAL HIGH (ref 70–99)
Potassium: 4.4 mmol/L (ref 3.5–5.1)
Sodium: 134 mmol/L — ABNORMAL LOW (ref 135–145)

## 2021-05-01 LAB — POCT I-STAT 7, (LYTES, BLD GAS, ICA,H+H)
Acid-base deficit: 4 mmol/L — ABNORMAL HIGH (ref 0.0–2.0)
Bicarbonate: 23.3 mmol/L (ref 20.0–28.0)
Calcium, Ion: 1.11 mmol/L — ABNORMAL LOW (ref 1.15–1.40)
HCT: 28 % — ABNORMAL LOW (ref 39.0–52.0)
Hemoglobin: 9.5 g/dL — ABNORMAL LOW (ref 13.0–17.0)
O2 Saturation: 99 %
Patient temperature: 35.1
Potassium: 4.7 mmol/L (ref 3.5–5.1)
Sodium: 138 mmol/L (ref 135–145)
TCO2: 25 mmol/L (ref 22–32)
pCO2 arterial: 50.9 mmHg — ABNORMAL HIGH (ref 32.0–48.0)
pH, Arterial: 7.258 — ABNORMAL LOW (ref 7.350–7.450)
pO2, Arterial: 179 mmHg — ABNORMAL HIGH (ref 83.0–108.0)

## 2021-05-01 LAB — PREPARE RBC (CROSSMATCH)

## 2021-05-01 SURGERY — VIDEO ASSISTED THORACOSCOPY
Anesthesia: General | Laterality: Left

## 2021-05-01 MED ORDER — SUCCINYLCHOLINE CHLORIDE 200 MG/10ML IV SOSY
PREFILLED_SYRINGE | INTRAVENOUS | Status: DC | PRN
  Administered 2021-05-01: 120 mg via INTRAVENOUS

## 2021-05-01 MED ORDER — FENTANYL CITRATE (PF) 250 MCG/5ML IJ SOLN
INTRAMUSCULAR | Status: AC
  Filled 2021-05-01: qty 5

## 2021-05-01 MED ORDER — ORAL CARE MOUTH RINSE: 15.0000 mL | Freq: Once | OROMUCOSAL | Status: AC

## 2021-05-01 MED ORDER — ETOMIDATE 2 MG/ML IV SOLN
INTRAVENOUS | Status: AC
  Filled 2021-05-01: qty 10

## 2021-05-01 MED ORDER — LIDOCAINE 2% (20 MG/ML) 5 ML SYRINGE
INTRAMUSCULAR | Status: DC | PRN
  Administered 2021-05-01: 40 mg via INTRAVENOUS

## 2021-05-01 MED ORDER — PHENYLEPHRINE 40 MCG/ML (10ML) SYRINGE FOR IV PUSH (FOR BLOOD PRESSURE SUPPORT)
PREFILLED_SYRINGE | INTRAVENOUS | Status: AC
  Filled 2021-05-01: qty 10

## 2021-05-01 MED ORDER — DEXAMETHASONE SODIUM PHOSPHATE 10 MG/ML IJ SOLN
INTRAMUSCULAR | Status: AC
  Filled 2021-05-01: qty 1

## 2021-05-01 MED ORDER — ROCURONIUM BROMIDE 10 MG/ML (PF) SYRINGE
PREFILLED_SYRINGE | INTRAVENOUS | Status: AC
  Filled 2021-05-01: qty 10

## 2021-05-01 MED ORDER — PHENYLEPHRINE 40 MCG/ML (10ML) SYRINGE FOR IV PUSH (FOR BLOOD PRESSURE SUPPORT)
PREFILLED_SYRINGE | INTRAVENOUS | Status: DC | PRN
  Administered 2021-05-01: 80 ug via INTRAVENOUS

## 2021-05-01 MED ORDER — CEFAZOLIN SODIUM 1 G IJ SOLR
INTRAMUSCULAR | Status: AC
  Filled 2021-05-01: qty 20

## 2021-05-01 MED ORDER — BUPIVACAINE LIPOSOME 1.3 % IJ SUSP
INTRAMUSCULAR | Status: AC
  Filled 2021-05-01: qty 20

## 2021-05-01 MED ORDER — 0.9 % SODIUM CHLORIDE (POUR BTL) OPTIME
TOPICAL | Status: DC | PRN
  Administered 2021-05-01: 2000 mL

## 2021-05-01 MED ORDER — SODIUM CHLORIDE 0.9% IV SOLUTION: Freq: Once | INTRAVENOUS | Status: DC

## 2021-05-01 MED ORDER — LIDOCAINE 2% (20 MG/ML) 5 ML SYRINGE
INTRAMUSCULAR | Status: AC
  Filled 2021-05-01: qty 5

## 2021-05-01 MED ORDER — SUGAMMADEX SODIUM 200 MG/2ML IV SOLN
INTRAVENOUS | Status: DC | PRN
  Administered 2021-05-01: 200 mg via INTRAVENOUS

## 2021-05-01 MED ORDER — PROPOFOL 10 MG/ML IV BOLUS
INTRAVENOUS | Status: AC
  Filled 2021-05-01: qty 40

## 2021-05-01 MED ORDER — ONDANSETRON HCL 4 MG/2ML IJ SOLN
INTRAMUSCULAR | Status: DC | PRN
  Administered 2021-05-01: 4 mg via INTRAVENOUS

## 2021-05-01 MED ORDER — ONDANSETRON HCL 4 MG/2ML IJ SOLN
INTRAMUSCULAR | Status: AC
  Filled 2021-05-01: qty 2

## 2021-05-01 MED ORDER — BUPIVACAINE HCL (PF) 0.25 % IJ SOLN
INTRAMUSCULAR | Status: AC
  Filled 2021-05-01: qty 10

## 2021-05-01 MED ORDER — ETOMIDATE 2 MG/ML IV SOLN
INTRAVENOUS | Status: DC | PRN
  Administered 2021-05-01: 18 mg via INTRAVENOUS

## 2021-05-01 MED ORDER — LACTATED RINGERS IV SOLN: INTRAVENOUS | Status: DC

## 2021-05-01 MED ORDER — ROCURONIUM BROMIDE 10 MG/ML (PF) SYRINGE
PREFILLED_SYRINGE | INTRAVENOUS | Status: DC | PRN
  Administered 2021-05-01: 50 mg via INTRAVENOUS
  Administered 2021-05-01: 30 mg via INTRAVENOUS

## 2021-05-01 MED ORDER — ALBUMIN HUMAN 5 % IV SOLN
12.5000 g | INTRAVENOUS | Status: AC
  Administered 2021-05-01: 12.5 g via INTRAVENOUS
  Filled 2021-05-01: qty 250

## 2021-05-01 MED ORDER — PHENYLEPHRINE HCL-NACL 20-0.9 MG/250ML-% IV SOLN
INTRAVENOUS | Status: DC | PRN
  Administered 2021-05-01: 40 ug/min via INTRAVENOUS

## 2021-05-01 MED ORDER — CEFAZOLIN SODIUM-DEXTROSE 2-3 GM-%(50ML) IV SOLR
INTRAVENOUS | Status: DC | PRN
  Administered 2021-05-01: 2 g via INTRAVENOUS

## 2021-05-01 MED ORDER — CHLORHEXIDINE GLUCONATE CLOTH 2 % EX PADS
6.0000 | MEDICATED_PAD | Freq: Every day | CUTANEOUS | Status: DC
  Administered 2021-05-02: 6 via TOPICAL

## 2021-05-01 MED ORDER — ALBUMIN HUMAN 5 % IV SOLN
INTRAVENOUS | Status: DC | PRN
Start: 2021-05-01 — End: 2021-05-01

## 2021-05-01 MED ORDER — DEXAMETHASONE SODIUM PHOSPHATE 10 MG/ML IJ SOLN
INTRAMUSCULAR | Status: DC | PRN
  Administered 2021-05-01: 5 mg via INTRAVENOUS

## 2021-05-01 MED ORDER — CHLORHEXIDINE GLUCONATE 0.12 % MT SOLN
OROMUCOSAL | Status: AC
  Administered 2021-05-01: 15 mL via OROMUCOSAL
  Filled 2021-05-01: qty 15

## 2021-05-01 MED ORDER — FENTANYL CITRATE (PF) 250 MCG/5ML IJ SOLN
INTRAMUSCULAR | Status: DC | PRN
  Administered 2021-05-01 (×2): 50 ug via INTRAVENOUS

## 2021-05-01 MED ORDER — CHLORHEXIDINE GLUCONATE 0.12 % MT SOLN: 15.0000 mL | Freq: Once | OROMUCOSAL | Status: AC

## 2021-05-01 MED ORDER — BUPIVACAINE HCL (PF) 0.25 % IJ SOLN
INTRAMUSCULAR | Status: AC
  Filled 2021-05-01: qty 30

## 2021-05-01 MED ORDER — STERILE WATER FOR IRRIGATION IR SOLN
Status: DC | PRN
  Administered 2021-05-01: 1700 mL

## 2021-05-01 MED ORDER — SUCCINYLCHOLINE CHLORIDE 200 MG/10ML IV SOSY
PREFILLED_SYRINGE | INTRAVENOUS | Status: AC
  Filled 2021-05-01: qty 10

## 2021-05-01 SURGICAL SUPPLY — 110 items
ADH SKN CLS APL DERMABOND .7 (GAUZE/BANDAGES/DRESSINGS) ×1
APL PRP STRL LF DISP 70% ISPRP (MISCELLANEOUS) ×2
APPLIER CLIP ROT 10 11.4 M/L (STAPLE)
APR CLP MED LRG 11.4X10 (STAPLE)
BAG SPEC RTRVL LRG 6X4 10 (ENDOMECHANICALS)
BLADE CLIPPER SURG (BLADE) ×2 IMPLANT
CANISTER SUCT 3000ML PPV (MISCELLANEOUS) ×2 IMPLANT
CATH ROBINSON RED A/P 14FR (CATHETERS) IMPLANT
CATH THORACIC 28FR (CATHETERS) ×1 IMPLANT
CATH THORACIC 28FR RT ANG (CATHETERS) IMPLANT
CATH THORACIC 36FR (CATHETERS) IMPLANT
CATH THORACIC 36FR RT ANG (CATHETERS) IMPLANT
CATH TROCAR 20FR (CATHETERS) IMPLANT
CHLORAPREP W/TINT 26 (MISCELLANEOUS) ×3 IMPLANT
CLIP APPLIE ROT 10 11.4 M/L (STAPLE) IMPLANT
CLIP VESOCCLUDE MED 6/CT (CLIP) ×2 IMPLANT
CNTNR URN SCR LID CUP LEK RST (MISCELLANEOUS) ×2 IMPLANT
CONN ST 1/4X3/8  BEN (MISCELLANEOUS)
CONN ST 1/4X3/8 BEN (MISCELLANEOUS) IMPLANT
CONN Y 3/8X3/8X3/8  BEN (MISCELLANEOUS)
CONN Y 3/8X3/8X3/8 BEN (MISCELLANEOUS) IMPLANT
CONT SPEC 4OZ STRL OR WHT (MISCELLANEOUS) ×2
COVER SURGICAL LIGHT HANDLE (MISCELLANEOUS) IMPLANT
DEFOGGER SCOPE WARMER CLEARIFY (MISCELLANEOUS) ×1 IMPLANT
DERMABOND ADVANCED (GAUZE/BANDAGES/DRESSINGS) ×1
DERMABOND ADVANCED .7 DNX12 (GAUZE/BANDAGES/DRESSINGS) IMPLANT
DISSECTOR BLUNT TIP ENDO 5MM (MISCELLANEOUS) IMPLANT
DRAIN CHANNEL 28F RND 3/8 FF (WOUND CARE) IMPLANT
DRAIN CHANNEL 32F RND 10.7 FF (WOUND CARE) IMPLANT
DRAPE WARM FLUID 44X44 (DRAPES) ×2 IMPLANT
ELECT BLADE 4.0 EZ CLEAN MEGAD (MISCELLANEOUS) ×2
ELECT BLADE 6.5 EXT (BLADE) ×2 IMPLANT
ELECT REM PT RETURN 9FT ADLT (ELECTROSURGICAL) ×2
ELECTRODE BLDE 4.0 EZ CLN MEGD (MISCELLANEOUS) IMPLANT
ELECTRODE REM PT RTRN 9FT ADLT (ELECTROSURGICAL) ×1 IMPLANT
GAUZE 4X4 16PLY ~~LOC~~+RFID DBL (SPONGE) ×1 IMPLANT
GAUZE SPONGE 4X4 12PLY STRL (GAUZE/BANDAGES/DRESSINGS) ×2 IMPLANT
GAUZE SPONGE 4X4 12PLY STRL LF (GAUZE/BANDAGES/DRESSINGS) ×1 IMPLANT
GLOVE SURG ENC MOIS LTX SZ7 (GLOVE) ×4 IMPLANT
GLOVE SURG MICRO LTX SZ6 (GLOVE) ×1 IMPLANT
GOWN STRL REUS W/ TWL LRG LVL3 (GOWN DISPOSABLE) ×2 IMPLANT
GOWN STRL REUS W/ TWL XL LVL3 (GOWN DISPOSABLE) ×1 IMPLANT
GOWN STRL REUS W/TWL LRG LVL3 (GOWN DISPOSABLE) ×6
GOWN STRL REUS W/TWL XL LVL3 (GOWN DISPOSABLE) ×2
HANDLE STAPLE ENDO GIA SHORT (STAPLE)
HEMOSTAT SURGICEL 2X14 (HEMOSTASIS) IMPLANT
KIT BASIN OR (CUSTOM PROCEDURE TRAY) ×2 IMPLANT
KIT SUCTION CATH 14FR (SUCTIONS) IMPLANT
KIT TURNOVER KIT B (KITS) ×2 IMPLANT
L-HOOK LAP DISP 36CM (ELECTROSURGICAL) ×2
LHOOK LAP DISP 36CM (ELECTROSURGICAL) IMPLANT
NDL HYPO 25GX1X1/2 BEV (NEEDLE) IMPLANT
NDL SPNL 18GX3.5 QUINCKE PK (NEEDLE) IMPLANT
NEEDLE 22X1 1/2 (OR ONLY) (NEEDLE) ×2 IMPLANT
NEEDLE HYPO 25GX1X1/2 BEV (NEEDLE) ×2 IMPLANT
NEEDLE SPNL 18GX3.5 QUINCKE PK (NEEDLE) IMPLANT
NS IRRIG 1000ML POUR BTL (IV SOLUTION) ×6 IMPLANT
PACK CHEST (CUSTOM PROCEDURE TRAY) ×2 IMPLANT
PACK UNIVERSAL I (CUSTOM PROCEDURE TRAY) ×3 IMPLANT
PAD ARMBOARD 7.5X6 YLW CONV (MISCELLANEOUS) ×5 IMPLANT
POUCH ENDO CATCH II 15MM (MISCELLANEOUS) IMPLANT
POUCH SPECIMEN RETRIEVAL 10MM (ENDOMECHANICALS) IMPLANT
SCISSORS LAP 5X35 DISP (ENDOMECHANICALS) IMPLANT
SEALANT PROGEL (MISCELLANEOUS) IMPLANT
SEALANT SURG COSEAL 4ML (VASCULAR PRODUCTS) IMPLANT
SEALANT SURG COSEAL 8ML (VASCULAR PRODUCTS) IMPLANT
SEALER LIGASURE MARYLAND 30 (ELECTROSURGICAL) ×2 IMPLANT
SET IRRIG TUBING LAPAROSCOPIC (IRRIGATION / IRRIGATOR) IMPLANT
SPECIMEN JAR MEDIUM (MISCELLANEOUS) IMPLANT
SPONGE INTESTINAL PEANUT (DISPOSABLE) ×4 IMPLANT
SPONGE T-LAP 18X18 ~~LOC~~+RFID (SPONGE) ×1 IMPLANT
SPONGE T-LAP 4X18 ~~LOC~~+RFID (SPONGE) ×1 IMPLANT
SPONGE TONSIL 1 RF SGL (DISPOSABLE) ×1 IMPLANT
SPONGE TONSIL TAPE 1 RFD (DISPOSABLE) ×1 IMPLANT
STAPLER ENDO GIA 12 SHRT THIN (STAPLE) ×1 IMPLANT
STAPLER ENDO GIA 12MM SHORT (STAPLE) IMPLANT
STOPCOCK 4 WAY LG BORE MALE ST (IV SETS) ×2 IMPLANT
SUT MNCRL AB 3-0 PS2 18 (SUTURE) ×1 IMPLANT
SUT MON AB 2-0 CT1 36 (SUTURE) IMPLANT
SUT PDS AB 1 CTX 36 (SUTURE) IMPLANT
SUT PROLENE 4 0 RB 1 (SUTURE)
SUT PROLENE 4-0 RB1 .5 CRCL 36 (SUTURE) IMPLANT
SUT SILK  1 MH (SUTURE) ×2
SUT SILK 1 MH (SUTURE) IMPLANT
SUT SILK 1 TIES 10X30 (SUTURE) ×2 IMPLANT
SUT SILK 2 0 SH (SUTURE) IMPLANT
SUT SILK 2 0SH CR/8 30 (SUTURE) IMPLANT
SUT VIC AB 1 CTX 36 (SUTURE)
SUT VIC AB 1 CTX36XBRD ANBCTR (SUTURE) IMPLANT
SUT VIC AB 2-0 CT1 27 (SUTURE) ×4
SUT VIC AB 2-0 CT1 TAPERPNT 27 (SUTURE) ×2 IMPLANT
SUT VIC AB 2-0 UR6 27 (SUTURE) ×1 IMPLANT
SUT VIC AB 3-0 SH 27 (SUTURE) ×4
SUT VIC AB 3-0 SH 27X BRD (SUTURE) ×1 IMPLANT
SUT VICRYL 0 UR6 27IN ABS (SUTURE) ×2 IMPLANT
SUT VICRYL 2 TP 1 (SUTURE) IMPLANT
SYR 10ML LL (SYRINGE) ×2 IMPLANT
SYR 30ML LL (SYRINGE) ×2 IMPLANT
SYR 50ML LL SCALE MARK (SYRINGE) ×2 IMPLANT
SYSTEM SAHARA CHEST DRAIN ATS (WOUND CARE) ×2 IMPLANT
TAPE CLOTH 4X10 WHT NS (GAUZE/BANDAGES/DRESSINGS) ×2 IMPLANT
TAPE CLOTH SURG 4X10 WHT LF (GAUZE/BANDAGES/DRESSINGS) ×1 IMPLANT
TAPE PAPER 3X10 WHT MICROPORE (GAUZE/BANDAGES/DRESSINGS) ×1 IMPLANT
TIP APPLICATOR SPRAY EXTEND 16 (VASCULAR PRODUCTS) IMPLANT
TOWEL GREEN STERILE (TOWEL DISPOSABLE) ×2 IMPLANT
TOWEL GREEN STERILE FF (TOWEL DISPOSABLE) ×2 IMPLANT
TRAY FOLEY MTR SLVR 16FR STAT (SET/KITS/TRAYS/PACK) ×1 IMPLANT
TROCAR XCEL BLADELESS 5X75MML (TROCAR) ×2 IMPLANT
TUBING EXTENTION W/L.L. (IV SETS) ×2 IMPLANT
WATER STERILE IRR 1000ML POUR (IV SOLUTION) ×2 IMPLANT

## 2021-05-01 NOTE — Anesthesia Postprocedure Evaluation (Signed)
Anesthesia Post Note  Patient: Kurt Bolton  Procedure(s) Performed: VIDEO BRONCHOSCOPY WITH ENDOBRONCHIAL NAVIGATION (Left) BRONCHIAL BRUSHINGS VIDEO BRONCHOSCOPY WITH RADIAL ENDOBRONCHIAL ULTRASOUND BRONCHIAL NEEDLE ASPIRATION BIOPSIES FIDUCIAL DYE MARKING XI ROBOTIC ASSISTED THORASCOPY-LEFT UPPER LOBECTOMY (Left: Chest) INTERCOSTAL NERVE BLOCK (Left: Chest) LYMPH NODE BIOPSY (Left: Chest)     Patient location during evaluation: PACU Anesthesia Type: General Level of consciousness: awake and alert Pain management: pain level controlled Vital Signs Assessment: post-procedure vital signs reviewed and stable Respiratory status: spontaneous breathing, nonlabored ventilation, respiratory function stable and patient connected to nasal cannula oxygen Cardiovascular status: blood pressure returned to baseline and stable Postop Assessment: no apparent nausea or vomiting Anesthetic complications: no   No notable events documented.  Last Vitals:  Vitals:   05/01/21 1200 05/01/21 1300  BP: 107/79 115/64  Pulse: 71   Resp: 16 12  Temp: (!) 36.4 C   SpO2: 92% 95%    Last Pain:  Vitals:   05/01/21 1305  TempSrc:   PainSc: 2                  March Rummage Isair Inabinet

## 2021-05-01 NOTE — Anesthesia Procedure Notes (Addendum)
Arterial Line Insertion Start/End8/01/2021 9:05 AM, 05/01/2021 9:14 AM Performed by: Effie Berkshire, MD, anesthesiologist  Patient location: Pre-op. Preanesthetic checklist: patient identified, IV checked, site marked, risks and benefits discussed, surgical consent, monitors and equipment checked, pre-op evaluation, timeout performed and anesthesia consent Lidocaine 1% used for infiltration Left, radial was placed Catheter size: 20 G Hand hygiene performed  and maximum sterile barriers used   Attempts: 2 Procedure performed without using ultrasound guided technique. Following insertion, dressing applied and Biopatch. Post procedure assessment: normal and unchanged  Patient tolerated the procedure well with no immediate complications.

## 2021-05-01 NOTE — Anesthesia Postprocedure Evaluation (Signed)
Anesthesia Post Note  Patient: Jantzen Pilger  Procedure(s) Performed: VIDEO ASSISTED THORACOSCOPY (Left)     Patient location during evaluation: PACU Anesthesia Type: General Level of consciousness: awake and alert Pain management: pain level controlled Vital Signs Assessment: post-procedure vital signs reviewed and stable Respiratory status: spontaneous breathing, nonlabored ventilation, respiratory function stable and patient connected to nasal cannula oxygen Cardiovascular status: blood pressure returned to baseline and stable Postop Assessment: no apparent nausea or vomiting Anesthetic complications: no   No notable events documented.  Last Vitals:  Vitals:   05/01/21 1145 05/01/21 1200  BP: 113/62 107/79  Pulse: 72 71  Resp: 13 16  Temp:  (!) 36.4 C  SpO2: 92% 92%    Last Pain:  Vitals:   05/01/21 1200  TempSrc:   PainSc: 0-No pain                 Effie Berkshire

## 2021-05-01 NOTE — Progress Notes (Signed)
     [address]       [place] [address]             [phone]       Significant ct output overnight Labile BP  OR today for re-exploration  Dalessandro Baldyga O Treshon Stannard

## 2021-05-01 NOTE — Progress Notes (Addendum)
Patient has had 3 almost back to back episodes similar to before when his HR drops and he goes unresponsive.  He comes back to in less than a minute.  This event started when he was moved to get his chest xray.  BP 74/53.  Total 962mL in [place]. Paged Dr. Roxan Hockey.  Advised to put patient on -20cm of suction d/t minimal air leak.  Also will give 1 unit PRBC and 1 bottle 5% albumin.

## 2021-05-01 NOTE — Care Management (Signed)
05-01-21 1440 [facility] notification submitted for this patient. Referral ID [id number] and  Notification ID- [id number]. Case Manager will continue to follow for disposition needs.

## 2021-05-01 NOTE — Discharge Summary (Signed)
[address]       [place] [address]             [phone]       Physician Discharge Summary  Patient ID: Kurt Bolton MRN: [id number] DOB/AGE: [birth year] 75 y.o.  Admit date: 04/30/2021 Discharge date: 05/20/2021  Admission Diagnoses:  Patient Active Problem List   Diagnosis Date Noted   S/P lobectomy of lung 04/30/2021   Lung nodule 04/09/2021     Discharge Diagnoses:  Principal Problem:   Lung nodule Active Problems:   S/P lobectomy of lung   Discharged Condition: good  History of Present Illness:    Kurt Bolton 75 y.o. male presents for surgical evaluation of a spiculated left upper lobe pulmonary nodule.  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  He continues to smoke but has cut down to half a pack a day.  He was in the lung cancer screening program through the [place], and was noted to have a suspicious nodule that was followed.  He subsequently underwent a PET/CT which showed avidity within this lesion.  He was then referred to cardiothoracic surgery for further evaluation.   The patient denies any weight changes, and has maintained her weight between 130 and 135 pounds for several years.  He denies any neurologic symptoms.  He does complain of groin pain but this is due to a recurrence in his inguinal hernia.  When he exerts himself he does have some shortness of breath, but denies any chest pain.     Smoking Hx: Continues to smoke half a pack a day.  Options for managing suspected primary lung cancer were discussed with Kurt Bolton by Dr. Kipp Brood and the decision was made to proceed with robotic-assisted left upper lobectomy. Pre-operative workup included PFTS that showed sufficient reserve function permit the proposed resection.  A myocardial perfusion study at rest and during stress was normal.   Hospital Course:  Kurt Bolton was admitted to the hospital for same-day surgery on 04/30/2021.  After usual preoperative  preparation, he was taken to the endoscopy suite where navigational bronchoscopy with biopsy was carried out by Dr. Valeta Harms.  This confirmed carcinoma.  Patient was transferred to the operating room where we are proceeded with left robotic assisted upper lobectomy with lymph node sampling.  The tumor was invasive to the chest wall in the left apex.  Apical pleurectomy was performed at that time.  Following the procedure, patient was transferred to the postanesthesia care unit where he remained hemodynamically stable.  He was taken back to the OR on 8/5 for an exploration and drainage of hemothorax, control of bleeding.  He required transfusion of 3 units of packed red blood cells to replace perioperative losses.  Following this procedure, he was transferred to the ICU.  He remained hemodynamically stable and had gradual tapering of the chest tube drainage.  He had gradually improving aeration of the residual left lung.  By the second postoperative day, he was transferred back to [facility] progressive care. POD 3 he continued to progress. He was encouraged to ambulate in the halls. He still had an air leak so his chest tube was kept in place. He placed him back to suction since his left pneumothorax increased. His CXR improved the following day therefore we were able to switch him back to water seal. We continued serial CXR to closely monitor his pneumothorax. His air leak continued but his CXR remained stable.  There was no air leak noted on 08/14. Patient had complaints of constipation, abdominal cramping. He was given laxatives. No air leak was seen on 8/15 and CXR remained stable. Therefore, chest tube was clamped around 8am. CXR at noon showed Stable position of left chest tube. Previously noted barely perceptible left apical pneumothorax is difficult to visualize on the current exam. Follow-up CXR showed no pneumothorax s.p left-sided chest tube removal. Today, he is stable to be transferred to SNF when bed is available.  He was having some nicotine cravings therefore we gave him a nicotine patch. He has had a COVID test ordered which was negative. He remains stable and is ready for SNF transfer.     Consults: None  Significant Diagnostic Studies:   Pathology:  SURGICAL PATHOLOGY  CASE: [id number]  PATIENT: Kurt Bolton  Surgical Pathology Report   Clinical History: pulmonary nodule (cm)   FINAL MICROSCOPIC DIAGNOSIS:   A. LUNG, LEFT UPPER LOBE, LOBECTOMY:  - Poorly differentiated non-small cell carcinoma, 2.5 cm.  - Margins not involved.  - Perivascular lymph node negative for carcinoma.  - See oncology table and comment.   B. LYMPH NODE, LEVEL 9 #1, EXCISION:  - Anthracotic lymph node negative for carcinoma.   C. LYMPH NODE, HILAR #1, EXCISION:  - Anthracotic lymph node negative for carcinoma.   D. LYMPH NODE, HILAR #2, EXCISION:  - Anthracotic lymph node negative for carcinoma.   E. LYMPH NODE, LEVEL 5 #1, EXCISION:  - Anthracotic lymph node negative for carcinoma.   F. LYMPH NODE, LEVEL 5 #2, EXCISION:  - Anthracotic lymph node negative for carcinoma.   G. LYMPH NODE, HILAR #3, EXCISION:  - Anthracotic lymph node negative for carcinoma.   H. LYMPH NODE, HILAR #4, EXCISION:  - Anthracotic lymph node negative for carcinoma.   I. LYMPH NODE, HILAR #5, EXCISION:  - Anthracotic lymph node negative for carcinoma.   J. PLEURA, APICAL, BIOPSY:  - Pleura with fibrosis and mild chronic inflammation.  - No malignancy identified.   CLINICAL DATA:  Status post chest tube removal.    PORTABLE CHEST 1 VIEW   COMPARISON:  May 11, 2021.   FINDINGS: The heart size and mediastinal contours are within normal limits. Status post left-sided chest tube removal. No pneumothorax is noted. Postsurgical changes are noted in the left lung apex. Right apical scarring is again noted. Calcified granuloma is stable in right lower lobe. Stable mild subcutaneous emphysema is noted over  left lateral chest wall. The visualized skeletal structures are unremarkable.   IMPRESSION: No pneumothorax status post left-sided chest tube removal.     Electronically Signed   By: Marijo Conception M.D.   On: 05/12/2021 08:36  Treatments:   04/30/2021   Patient:  Kurt Bolton Pre-Op Dx: Left upper lobe pulmonary nodule Post-op Dx: Left upper lobe carcinoma Procedure: - Robotic assisted left video thoracoscopy -Left upper lobectomy -Apical pleurectomy - Mediastinal lymph node sampling - Intercostal nerve block   Surgeon and Role:      * Lightfoot, Lucile Crater, MD - Primary    *M. Roddenberry, PA-C- assisting   Anesthesia  general EBL: 150 ml Blood Administration: None Specimen: Left upper lobe.  Apical pleura.  5, 9, hilar lymph nodes.   Drains: 36 F argyle chest tube in left chest Counts: correct     Indications: 75 year old male with a 1.9 cm left upper lobe with an SUV uptake of 2.1.  He has a significant smoking history and was identified in  our lung cancer screening program.  He was deemed a good candidate for a combined a robotic bronchoscopy with biopsy and marking followed by left upper lobectomy performed robotically as well.   Findings: There were significant adhesions to the apex of the left upper lobe.  It did appear as though the pulmonary nodule was growing into the apex of the chest wall.  After getting the lobe down and performing a lobectomy the pleura was resected at the point of attachment, but there were some areas that were deeply invested.  This likely was due to chest wall involvement.  The area was then marked with clips given the strong likelihood that he will need adjuvant radiation.   Operative Technique: After the risks, benefits and alternatives were thoroughly discussed, the patient was brought to the operative theatre.  Anesthesia was induced, and the patient was then placed in a right lateral decubitus position and was prepped and draped in  normal sterile fashion.  An appropriate surgical pause was performed, and pre-operative antibiotics were dosed accordingly.   We began by placing our 4 robotic ports in the the 7th intercostal space targeting the hilum of the lung.  A 61mm assistant port was placed in the 9th intercostal space in the anterior axillary line.  The robot was then docked and all instruments were passed under direct visualization.    The lung was then retracted superiorly, and the inferior pulmonary ligament was divided.  As we worked our way up to the apex of the left upper lobe multiple adhesions were taken down.  The nodule was deeply invested into the parietal pleura.  Great care was used to take this down but there is a good chance that there was some residual tumor left in the apex.  I then performed an apical pleurectomy to resect this.     The hilum was mobilized anteriorly and posteriorly.  We identified the upper lobe pulmonary vein, and after careful isolation, it was divided with a vascular stapler.  We next moved to the pulmonary artery truncal branches.  The artery was then divided with a vascular load stapler.  The bronchus to the upper lobe was then isolated.  After a test clamp, with good ventilation of the lower lobe, the bronchus was then divided.  The fissure was completed, and the specimen was passed into an endocatch bag.  It was removed from the anterior access site.     Lymph nodes were then sampled at levels 5, 9, and hilum.  The chest was irrigated, and an air leak test was performed.  An intercostal nerve block was performed under direct visualization.  A 28 F chest with then placed, and we watch the remaining lobes re-expand.  The skin and soft tissue were closed with absorbable suture     The patient tolerated the procedure without any immediate complications, and was transferred to the PACU in stable condition.   Kurt Bolton  Discharge Exam: Blood pressure 119/69, pulse 72, temperature  98.7 F (37.1 C), temperature source Oral, resp. rate 19, height 6' (1.829 m), weight 59.6 kg, SpO2 95 %.  General appearance: alert, cooperative, and no distress Heart: regular rate and rhythm Lungs: clear to auscultation bilaterally Abdomen: soft, non-tender; bowel sounds normal; no masses,  no organomegaly Extremities: extremities normal, atraumatic, no cyanosis or edema Wound: well healed  Discharge disposition: 03-Skilled Nursing Facility  Allergies as of 05/20/2021   No Known Allergies      Medication List  TAKE these medications    aspirin 325 MG EC tablet Take 1 tablet (325 mg total) by mouth daily. What changed: how much to take   atorvastatin 40 MG tablet Commonly known as: LIPITOR Take 40 mg by mouth every evening.   buPROPion ER 100 MG 12 hr tablet Commonly known as: WELLBUTRIN SR Take 100 mg by mouth daily as needed (depression).   Fish Oil 1000 MG Caps Take 1,000 mg by mouth in the morning and at bedtime.   gabapentin 100 MG capsule Commonly known as: NEURONTIN Take 200 mg by mouth at bedtime as needed (pain).   multivitamin tablet Take 1 tablet by mouth daily.   nicotine 14 mg/24hr patch Commonly known as: NICODERM CQ - dosed in mg/24 hours Place 1 patch (14 mg total) onto the skin daily.   oxyCODONE 5 MG immediate release tablet Commonly known as: Oxy IR/ROXICODONE Take 1 tablet (5 mg total) by mouth every 6 (six) hours as needed for moderate pain or breakthrough pain.   oxymetazoline 0.05 % nasal spray Commonly known as: AFRIN Place 1 spray into both nostrils 2 (two) times daily as needed for congestion.   phenylephrine 1 % nasal spray Commonly known as: NEO-SYNEPHRINE Place 1 drop into both nostrils every 6 (six) hours as needed for congestion.   vitamin A & D ointment Apply 1 application topically as needed for dry skin.         Follow-up Information     Windy Fast, MD. Call today.   Specialty: Internal Medicine Contact  information: [address] [place] [address] [phone]                 Signed:  Original note by: Elgie Collard PA-C   Released by:  Ellwood Handler, PA-C 05/20/2021

## 2021-05-01 NOTE — Brief Op Note (Signed)
04/30/2021 - 05/01/2021  10:41 AM  PATIENT:  Kurt Bolton  75 y.o. male  PRE-OPERATIVE DIAGNOSIS:  Hemothorax, S/P LEFT ROBOTIC LUL LOBECTOMY AND APICAL PLEURECTOMY, LN SAMPLING  POST-OPERATIVE DIAGNOSIS:  SAME  PROCEDURE:  Procedure(s): VIDEO ASSISTED THORACOSCOPY (Left), EXPLORATION AND DRAINAGE OF HEMOTHORAX ,  CONTROL OF BLEEDING   SURGEON:  Surgeon(s) and Role:    * Lightfoot, Lucile Crater, MD - Primary  PHYSICIAN ASSISTANT: Faizon Capozzi PA-C  ANESTHESIA:   general  EBL:  MINIMAL, 750 CC OLD BLOOD EVACUATED  BLOOD ADMINISTERED: 3 UNITS PRBC'S  DRAINS:  1 28 F CHEST TUBE IN LEFT HEMITHORAX    LOCAL MEDICATIONS USED:  NONE  SPECIMEN:  No Specimen  DISPOSITION OF SPECIMEN:  N/A  COUNTS:  YES  TOURNIQUET:  * No tourniquets in log *  DICTATION: .Dragon Dictation  PLAN OF CARE: Admit to inpatient   PATIENT DISPOSITION:  ICU - extubated and stable.   Delay start of Pharmacological VTE agent (>24hrs) due to surgical blood loss or risk of bleeding: yes  COMPLICATIONS: NO KNOWN

## 2021-05-01 NOTE — Anesthesia Procedure Notes (Signed)
Procedure Name: Intubation Date/Time: 05/01/2021 9:01 AM Performed by: Reece Agar, CRNA Pre-anesthesia Checklist: Patient identified, Emergency Drugs available, Suction available and Patient being monitored Patient Re-evaluated:Patient Re-evaluated prior to induction Oxygen Delivery Method: Circle System Utilized Preoxygenation: Pre-oxygenation with 100% oxygen Induction Type: IV induction Ventilation: Mask ventilation without difficulty Laryngoscope Size: Mac and 4 Endobronchial tube: Left, Double lumen EBT, EBT position confirmed by auscultation and EBT position confirmed by fiberoptic bronchoscope and 39 Fr Number of attempts: 1 Airway Equipment and Method: Stylet and Fiberoptic brochoscope Placement Confirmation: ETT inserted through vocal cords under direct vision, positive ETCO2 and breath sounds checked- equal and bilateral Secured at: 29 cm Tube secured with: Tape Dental Injury: Teeth and Oropharynx as per pre-operative assessment

## 2021-05-01 NOTE — Op Note (Signed)
     [address]       [place] [address]             [phone]       05/01/2021 Patient:  Kurt Bolton Pre-Op Dx: Hemothorax   Status post left upper lobectomy with apical pleurectomy Post-op Dx: Hemothorax Procedure: -Left video assisted thoracoscopy -Evacuation of hemothorax  Surgeon and Role:      * Ladana Chavero, Lucile Crater, MD - Primary    Evonnie Pat, PA-C- assisting   Anesthesia  general EBL: 500 mL of old blood  Blood Administration: 3 units of packed red blood cells Specimen: None  Drains: 28 F argyle chest tube in left chest Counts: correct    Indications: 75 year old male is postoperative day 1 status post robotic assisted left upper lobectomy with apical pleurectomy due to tumor invading chest wall.  He had significant bleeding overnight and was brought to the operating theater for evacuation of pleural hematoma. Findings: Significant clot.  There appeared to be some oozing from the apical pleurectomy site.  There was also some oozing from one of the trocar sites.  These were controlled with electrocautery.  CoSeal was then sprayed to the apical pleurectomy site for further hemostasis.  Operative Technique: After the risks, benefits and alternatives were thoroughly discussed, the patient was brought to the operative theatre.  Anesthesia was induced, the patient was then placed in a right lateral decubitus position and was prepped and draped in normal sterile fashion.  An appropriate surgical pause was performed, and pre-operative antibiotics were dosed accordingly.  The previous access incision site was opened and the chest tube was removed and this was used for our camera port site.  The hematoma was evacuated.  We irrigated the chest with sterile water.  We focused our attention to the apical pleurectomy site and there appeared to be some slow oozing but no active bleeding from the site.  Electrocautery was used to control this.  Next we performed a  general evaluation and there also was some slow oozing from one of the trocar sites.  This was also controlled with electrocautery.  We inspected the area further and there was no other sites of bleeding.  We irrigated with sterile water once again did not find any other bleeding sites.  The apical pleurectomy site was sprayed with CoSeal.   A 28 F chest tube was then placed, and we watch the lung re-expand.  The skin and soft tissue were closed with absorbable suture    The patient tolerated the procedure without any immediate complications, and was transferred to the PACU in stable condition.  Jere Bostrom [name]

## 2021-05-01 NOTE — Transfer of Care (Signed)
Immediate Anesthesia Transfer of Care Note  Patient: Kurt Bolton  Procedure(s) Performed: VIDEO ASSISTED THORACOSCOPY (Left)  Patient Location: PACU  Anesthesia Type:General  Level of Consciousness: drowsy  Airway & Oxygen Therapy: Patient Spontanous Breathing and Patient connected to face mask oxygen  Post-op Assessment: Report given to RN and Post -op Vital signs reviewed and stable  Post vital signs: Reviewed and stable   Last Vitals:  Vitals Value Taken Time  BP 161/52 05/01/21 1107  Temp    Pulse 80 05/01/21 1107  Resp 19 05/01/21 1107  SpO2 100 % 05/01/21 1107  Vitals shown include unvalidated device data.  Last Pain:  Vitals:   05/01/21 0823  TempSrc: Oral  PainSc:       Patients Stated Pain Goal: 0 (97/35/32 9924)  Complications: No notable events documented.

## 2021-05-01 NOTE — Progress Notes (Signed)
      [address]       [place] [address]             [phone]       Day of Surgery Procedure(s) (LRB): VIDEO ASSISTED THORACOSCOPY (Left) Subjective: Called to see patient for hypotension and bleeding from chest tube. SBP 60's-70's. Kurt Bolton is awake but is drowsy. Complains of pain in his back.   Hct 23% this AM, 1 uint PRBC's infusing, Albumin 291ml  x1 uint completed as well as NS bolus. BP improved from 70's to 160mmHg.  Objective: Vital signs in last 24 hours: Temp:  [96.7 F (35.9 C)-98.3 F (36.8 C)] 96.7 F (35.9 C) (08/05 0714) Pulse Rate:  [73-95] 78 (08/05 0714) Cardiac Rhythm: Normal sinus rhythm (08/05 0700) Resp:  [14-20] 14 (08/05 0714) BP: (72-124)/(50-80) 79/52 (08/05 0714) SpO2:  [93 %-100 %] 98 % (08/05 0714) Arterial Line BP: (106-111)/(48-53) 107/48 (08/04 1355)     Intake/Output from previous day: 08/04 0701 - 08/05 0700 In: 3808.2 [P.O.:840; I.V.:2233.2; Blood:335; IV Piggyback:400] Out: 1310 [Urine:385; Blood:125; Chest Tube:800] Intake/Output this shift: Total I/O In: -  Out: 1700 [Chest Tube:1700]  General appearance: moderate distress, slowed mentation, and skin is cool and moist Neurologic: slowed responses but does answer questions appropriately.  Heart: currently SR in the 70's Lungs: O2 sats 100% on 2Lnc. No increase in WOB. PleurEvac is full and is being changed.  Extremities: cool and clamy Wound: having bleeding aroud the chest tube . Palpable subQ air. Small air leak. CXR showing chest tube in good position, large apical space and poor left lower lobe expansion.   Lab Results: Recent Labs    04/30/21 2205 05/01/21 0552  WBC 12.6* 15.9*  HGB 8.8* 8.2*  HCT 26.1* 23.4*  PLT 180 168   BMET:  Recent Labs    04/28/21 1400 05/01/21 0552  NA 136 134*  K 4.2 4.4  CL 107 104  CO2 22 23  GLUCOSE 95 151*  BUN 17 25*  CREATININE 0.77 1.00  CALCIUM 9.2 8.1*    PT/INR:  Recent Labs    04/28/21 1400   LABPROT 13.8  INR 1.1   ABG    Component Value Date/Time   PHART 7.450 05/01/2021 0430   HCO3 23.9 05/01/2021 0430   ACIDBASEDEF 0.2 04/28/2021 1431   O2SAT 97.2 05/01/2021 0430   CBG (last 3)  Recent Labs    04/30/21 2348  GLUCAP 170*    Assessment/Plan: S/P Procedure(s) (LRB): VIDEO ASSISTED THORACOSCOPY (Left)  POD1 robotic-assisted left upper lobectomy for carcinoma invasive to apical chest wall. Active bleeding with hypovolemic shock signs. Discussed with Dr. Kipp Brood.. Continue fluid  / blood rescussication and return to OR stat for exploration / control of bleeding.    LOS: 1 day    Antony Odea, PA-C [phone] 05/01/2021

## 2021-05-01 NOTE — Anesthesia Preprocedure Evaluation (Addendum)
Anesthesia Evaluation  Patient identified by MRN, date of birth, ID band Patient awake    Reviewed: Allergy & Precautions, NPO status , Patient's Chart, lab work & pertinent test results  Airway Mallampati: I  TM Distance: >3 FB Neck ROM: Full    Dental  (+) Edentulous Upper, Edentulous Lower   Pulmonary COPD, former smoker,     + decreased breath sounds      Cardiovascular negative cardio ROS   Rhythm:Regular Rate:Normal     Neuro/Psych  Headaches, PSYCHIATRIC DISORDERS Depression    GI/Hepatic negative GI ROS, Neg liver ROS,   Endo/Other  negative endocrine ROS  Renal/GU negative Renal ROS     Musculoskeletal negative musculoskeletal ROS (+)   Abdominal Normal abdominal exam  (+)   Peds  Hematology negative hematology ROS (+)   Anesthesia Other Findings   Reproductive/Obstetrics                            Anesthesia Physical Anesthesia Plan  ASA: 3  Anesthesia Plan: General   Post-op Pain Management:    Induction: Intravenous  PONV Risk Score and Plan: 3 and Ondansetron, Dexamethasone and Midazolam  Airway Management Planned: Double Lumen EBT  Additional Equipment: None  Intra-op Plan:   Post-operative Plan: Extubation in OR  Informed Consent: I have reviewed the patients History and Physical, chart, labs and discussed the procedure including the risks, benefits and alternatives for the proposed anesthesia with the patient or authorized representative who has indicated his/her understanding and acceptance.     Dental advisory given  Plan Discussed with: CRNA  Anesthesia Plan Comments:        Anesthesia Quick Evaluation                                  Anesthesia Evaluation  Patient identified by MRN, date of birth, ID band Patient awake    Reviewed: Allergy & Precautions, NPO status , Patient's Chart, lab work & pertinent test results  Airway Mallampati:  II  TM Distance: >3 FB Neck ROM: Full    Dental  (+) Upper Dentures, Lower Dentures   Pulmonary COPD, former smoker,  Pulmonary nodule    Pulmonary exam normal        Cardiovascular + angina  Rhythm:Regular Rate:Normal     Neuro/Psych  Headaches, Depression    GI/Hepatic negative GI ROS, Neg liver ROS,   Endo/Other  negative endocrine ROS  Renal/GU negative Renal ROS  negative genitourinary   Musculoskeletal negative musculoskeletal ROS (+)   Abdominal (+)  Abdomen: soft.    Peds  Hematology negative hematology ROS (+)   Anesthesia Other Findings   Reproductive/Obstetrics                           Anesthesia Physical Anesthesia Plan  ASA: 3  Anesthesia Plan: General   Post-op Pain Management:    Induction: Intravenous  PONV Risk Score and Plan: 2 and Ondansetron, Dexamethasone and Treatment may vary due to age or medical condition  Airway Management Planned: Mask and Double Lumen EBT  Additional Equipment: Arterial line  Intra-op Plan:   Post-operative Plan: Extubation in OR  Informed Consent: I have reviewed the patients History and Physical, chart, labs and discussed the procedure including the risks, benefits and alternatives for the proposed anesthesia with the patient or authorized representative who  has indicated his/her understanding and acceptance.     Dental advisory given  Plan Discussed with: CRNA  Anesthesia Plan Comments: (Lab Results      Component                Value               Date                      WBC                      9.2                 04/28/2021                HGB                      13.1                04/28/2021                HCT                      38.1 (L)            04/28/2021                MCV                      95.5                04/28/2021                PLT                      232                 04/28/2021           Lab Results      Component                 Value               Date                      NA                       136                 04/28/2021                K                        4.2                 04/28/2021                CO2                      22                  04/28/2021                GLUCOSE                  95  04/28/2021                BUN                      17                  04/28/2021                CREATININE               0.77                04/28/2021                CALCIUM                  9.2                 04/28/2021                GFRNONAA                 >60                 04/28/2021           Nuclear stress 04/09/21: The left ventricular ejection fraction is hyperdynamic (>65%). Nuclear stress EF: 74%. The study is normal.   Normal resting and stress perfusion. No ischemia or infarction EF 74% )      Anesthesia Quick Evaluation                                   Anesthesia Evaluation  Patient identified by MRN, date of birth, ID band Patient awake    Reviewed: Allergy & Precautions, NPO status , Patient's Chart, lab work & pertinent test results  Airway Mallampati: II  TM Distance: >3 FB Neck ROM: Full    Dental  (+) Upper Dentures, Lower Dentures   Pulmonary COPD, former smoker,  Pulmonary nodule    Pulmonary exam normal        Cardiovascular + angina  Rhythm:Regular Rate:Normal     Neuro/Psych  Headaches, Depression    GI/Hepatic negative GI ROS, Neg liver ROS,   Endo/Other  negative endocrine ROS  Renal/GU negative Renal ROS  negative genitourinary   Musculoskeletal negative musculoskeletal ROS (+)   Abdominal (+)  Abdomen: soft.    Peds  Hematology negative hematology ROS (+)   Anesthesia Other Findings   Reproductive/Obstetrics                           Anesthesia Physical Anesthesia Plan  ASA: 3  Anesthesia Plan: General   Post-op Pain Management:    Induction: Intravenous  PONV Risk  Score and Plan: 2 and Ondansetron, Dexamethasone and Treatment may vary due to age or medical condition  Airway Management Planned: Mask and Double Lumen EBT  Additional Equipment: Arterial line  Intra-op Plan:   Post-operative Plan: Extubation in OR  Informed Consent: I have reviewed the patients History and Physical, chart, labs and discussed the procedure including the risks, benefits and alternatives for the proposed anesthesia with the patient or authorized representative who has indicated his/her understanding and acceptance.     Dental advisory given  Plan Discussed with: CRNA  Anesthesia Plan Comments: (Lab Results      Component  Value               Date                      WBC                      9.2                 04/28/2021                HGB                      13.1                04/28/2021                HCT                      38.1 (L)            04/28/2021                MCV                      95.5                04/28/2021                PLT                      232                 04/28/2021           Lab Results      Component                Value               Date                      NA                       136                 04/28/2021                K                        4.2                 04/28/2021                CO2                      22                  04/28/2021                GLUCOSE                  95                  04/28/2021                BUN  17                  04/28/2021                CREATININE               0.77                04/28/2021                CALCIUM                  9.2                 04/28/2021                GFRNONAA                 >60                 04/28/2021           Nuclear stress 04/09/21: The left ventricular ejection fraction is hyperdynamic (>65%). Nuclear stress EF: 74%. The study is normal.   Normal resting and stress perfusion. No ischemia or infarction  EF 74% )      Anesthesia Quick Evaluation

## 2021-05-02 ENCOUNTER — Encounter (HOSPITAL_COMMUNITY): Payer: Self-pay | Admitting: Thoracic Surgery (Cardiothoracic Vascular Surgery)

## 2021-05-02 LAB — CBC
HCT: 24.8 % — ABNORMAL LOW (ref 39.0–52.0)
Hemoglobin: 8.4 g/dL — ABNORMAL LOW (ref 13.0–17.0)
MCH: 30.5 pg (ref 26.0–34.0)
MCHC: 33.9 g/dL (ref 30.0–36.0)
MCV: 90.2 fL (ref 80.0–100.0)
Platelets: UNDETERMINED 10*3/uL (ref 150–400)
RBC: 2.75 MIL/uL — ABNORMAL LOW (ref 4.22–5.81)
RDW: 15.7 % — ABNORMAL HIGH (ref 11.5–15.5)
WBC: 16.3 10*3/uL — ABNORMAL HIGH (ref 4.0–10.5)
nRBC: 0 % (ref 0.0–0.2)

## 2021-05-02 LAB — COMPREHENSIVE METABOLIC PANEL
ALT: 13 U/L (ref 0–44)
AST: 30 U/L (ref 15–41)
Albumin: 2.5 g/dL — ABNORMAL LOW (ref 3.5–5.0)
Alkaline Phosphatase: 33 U/L — ABNORMAL LOW (ref 38–126)
Anion gap: 3 — ABNORMAL LOW (ref 5–15)
BUN: 31 mg/dL — ABNORMAL HIGH (ref 8–23)
CO2: 24 mmol/L (ref 22–32)
Calcium: 8.1 mg/dL — ABNORMAL LOW (ref 8.9–10.3)
Chloride: 108 mmol/L (ref 98–111)
Creatinine, Ser: 0.93 mg/dL (ref 0.61–1.24)
GFR, Estimated: >60 mL/min (ref >60)
Glucose, Bld: 109 mg/dL — ABNORMAL HIGH (ref 70–99)
Potassium: 4.2 mmol/L (ref 3.5–5.1)
Sodium: 135 mmol/L (ref 135–145)
Total Bilirubin: 0.9 mg/dL (ref 0.3–1.2)
Total Protein: 4 g/dL — ABNORMAL LOW (ref 6.5–8.1)

## 2021-05-02 MED ORDER — MORPHINE SULFATE (PF) 2 MG/ML IV SOLN
2.0000 mg | INTRAVENOUS | Status: DC | PRN
  Administered 2021-05-02 – 2021-05-08 (×13): 2 mg via INTRAVENOUS
  Filled 2021-05-02 (×13): qty 1

## 2021-05-02 MED ORDER — LIDOCAINE 5 % EX PTCH: 1.0000 | MEDICATED_PATCH | CUTANEOUS | Status: DC

## 2021-05-02 NOTE — Plan of Care (Signed)
  Problem: Education: Goal: Knowledge of General Education information will improve Description: Including pain rating scale, medication(s)/side effects and non-pharmacologic comfort measures Outcome: Progressing   Problem: Clinical Measurements: Goal: Ability to maintain clinical measurements within normal limits will improve Outcome: Progressing Goal: Will remain free from infection Outcome: Progressing   Problem: Activity: Goal: Risk for activity intolerance will decrease Outcome: Progressing   Problem: Nutrition: Goal: Adequate nutrition will be maintained Outcome: Progressing   Problem: Coping: Goal: Level of anxiety will decrease Outcome: Progressing   Problem: Pain Managment: Goal: General experience of comfort will improve Outcome: Progressing Note: Reinforce need to use pillow to splint when coughing/deep breathing for pain management.   Problem: Safety: Goal: Ability to remain free from injury will improve Outcome: Progressing   Problem: Skin Integrity: Goal: Risk for impaired skin integrity will decrease Outcome: Progressing   Problem: Education: Goal: Knowledge of disease or condition will improve Outcome: Progressing   Problem: Activity: Goal: Risk for activity intolerance will decrease Outcome: Progressing   Problem: Cardiac: Goal: Will achieve and/or maintain hemodynamic stability Outcome: Progressing   Problem: Clinical Measurements: Goal: Postoperative complications will be avoided or minimized Outcome: Progressing   Problem: Pain Management: Goal: Pain level will decrease Outcome: Progressing   Problem: Skin Integrity: Goal: Wound healing without signs and symptoms infection will improve Outcome: Progressing   Problem: Respiratory: Goal: Respiratory status will improve Outcome: Completed/Met

## 2021-05-02 NOTE — Progress Notes (Signed)
      [address]       [place] [address]             [phone]                 1 Day Post-Op Procedure(s) (LRB): VIDEO ASSISTED THORACOSCOPY (Left)   Events: No events _______________________________________________________________ Vitals: BP 110/70   Pulse 71   Temp 99.5 F (37.5 C)   Resp 17   Ht 6' (1.829 m)   Wt 61.2 kg   SpO2 93%   BMI 18.30 kg/m   - Neuro: alert NAD   - Cardiovascular: sinus  Drips: none.      - Pulm: EWOB Leak with cough.  Significant tidaling    ABG    Component Value Date/Time   PHART 7.258 (L) 05/01/2021 1006   PCO2ART 50.9 (H) 05/01/2021 1006   PO2ART 179 (H) 05/01/2021 1006   HCO3 23.3 05/01/2021 1006   TCO2 25 05/01/2021 1006   ACIDBASEDEF 4.0 (H) 05/01/2021 1006   O2SAT 99.0 05/01/2021 1006    - Abd: ND   .Intake/Output      08/05 0701 08/06 0700 08/06 0701 08/07 0700   P.Kurt.     I.V. (mL/kg) 1000 (16.3)    Blood 985    IV Piggyback 250    Total Intake(mL/kg) 2235 (36.5)    Urine (mL/kg/hr) 300 (0.2)    Blood 750    Chest Tube 2030    Total Output 3080    Net -845            _______________________________________________________________ Labs: CBC Latest Ref Rng & Units 05/02/2021 05/01/2021 05/01/2021  WBC 4.0 - 10.5 K/uL 16.3(H) - 15.9(H)  Hemoglobin 13.0 - 17.0 g/dL 8.4(L) 9.5(L) 8.2(L)  Hematocrit 39.0 - 52.0 % 24.8(L) 28.0(L) 23.4(L)  Platelets 150 - 400 K/uL PLATELET CLUMPS NOTED ON SMEAR, UNABLE TO ESTIMATE - 168   CMP Latest Ref Rng & Units 05/02/2021 05/01/2021 05/01/2021  Glucose 70 - 99 mg/dL 109(H) - 151(H)  BUN 8 - 23 mg/dL 31(H) - 25(H)  Creatinine 0.61 - 1.24 mg/dL 0.93 - 1.00  Sodium 135 - 145 mmol/L 135 138 134(L)  Potassium 3.5 - 5.1 mmol/L 4.2 4.7 4.4  Chloride 98 - 111 mmol/L 108 - 104  CO2 22 - 32 mmol/L 24 - 23  Calcium 8.9 - 10.3 mg/dL 8.1(L) - 8.1(L)  Total Protein 6.5 - 8.1 g/dL 4.0(L) - -  Total Bilirubin 0.3 - 1.2 mg/dL 0.9 - -  Alkaline Phos 38 - 126 U/L 33(L)  - -  AST 15 - 41 U/L 30 - -  ALT 0 - 44 U/L 13 - -    CXR: -  _______________________________________________________________  Assessment and Plan: POD 2 s/p LULectomy, POD 1 s/p evacuation of hemothorax  Neuro: adding morphine CV: stable Pulm: continue pulm toilet.  Will keep CT today Renal: good uop, creat stable GI: advancing diet Heme: stable ID: afebrile.  Will watch leukocytosis Endo: stable Dispo: 2C today   Kurt Bolton Kurt Bolton 05/02/2021 8:29 AM

## 2021-05-02 NOTE — Plan of Care (Signed)
  Problem: Education: Goal: Knowledge of General Education information will improve Description: Including pain rating scale, medication(s)/side effects and non-pharmacologic comfort measures Outcome: Progressing   Problem: Health Behavior/Discharge Planning: Goal: Ability to manage health-related needs will improve Outcome: Progressing   Problem: Clinical Measurements: Goal: Ability to maintain clinical measurements within normal limits will improve Outcome: Progressing Goal: Will remain free from infection Outcome: Progressing Goal: Diagnostic test results will improve Outcome: Progressing Goal: Respiratory complications will improve Outcome: Progressing Goal: Cardiovascular complication will be avoided Outcome: Progressing   Problem: Activity: Goal: Risk for activity intolerance will decrease Outcome: Progressing   Problem: Nutrition: Goal: Adequate nutrition will be maintained Outcome: Progressing   Problem: Coping: Goal: Level of anxiety will decrease Outcome: Progressing   Problem: Elimination: Goal: Will not experience complications related to bowel motility Outcome: Progressing Goal: Will not experience complications related to urinary retention Outcome: Progressing   Problem: Pain Managment: Goal: General experience of comfort will improve Outcome: Progressing   Problem: Safety: Goal: Ability to remain free from injury will improve Outcome: Progressing   Problem: Skin Integrity: Goal: Risk for impaired skin integrity will decrease Outcome: Progressing   Problem: Education: Goal: Knowledge of disease or condition will improve Outcome: Progressing Goal: Knowledge of the prescribed therapeutic regimen will improve Outcome: Progressing   Problem: Activity: Goal: Risk for activity intolerance will decrease Outcome: Progressing   Problem: Cardiac: Goal: Will achieve and/or maintain hemodynamic stability Outcome: Progressing   Problem: Clinical  Measurements: Goal: Postoperative complications will be avoided or minimized Outcome: Progressing   Problem: Pain Management: Goal: Pain level will decrease Outcome: Progressing   Problem: Skin Integrity: Goal: Wound healing without signs and symptoms infection will improve Outcome: Progressing

## 2021-05-03 ENCOUNTER — Inpatient Hospital Stay (HOSPITAL_COMMUNITY): Payer: No Typology Code available for payment source

## 2021-05-03 LAB — CBC
HCT: 24.5 % — ABNORMAL LOW (ref 39.0–52.0)
Hemoglobin: 8.3 g/dL — ABNORMAL LOW (ref 13.0–17.0)
MCH: 30.7 pg (ref 26.0–34.0)
MCHC: 33.9 g/dL (ref 30.0–36.0)
MCV: 90.7 fL (ref 80.0–100.0)
Platelets: 126 10*3/uL — ABNORMAL LOW (ref 150–400)
RBC: 2.7 MIL/uL — ABNORMAL LOW (ref 4.22–5.81)
RDW: 15.2 % (ref 11.5–15.5)
WBC: 13.3 10*3/uL — ABNORMAL HIGH (ref 4.0–10.5)
nRBC: 0 % (ref 0.0–0.2)

## 2021-05-03 MED ORDER — POLYETHYLENE GLYCOL 3350 17 G PO PACK
17.0000 g | PACK | Freq: Every day | ORAL | Status: DC
  Administered 2021-05-03 – 2021-05-08 (×6): 17 g via ORAL
  Filled 2021-05-03 (×6): qty 1

## 2021-05-03 NOTE — Plan of Care (Signed)
  Problem: Education: Goal: Knowledge of General Education information will improve Description: Including pain rating scale, medication(s)/side effects and non-pharmacologic comfort measures Outcome: Progressing   Problem: Health Behavior/Discharge Planning: Goal: Ability to manage health-related needs will improve Outcome: Progressing   Problem: Clinical Measurements: Goal: Ability to maintain clinical measurements within normal limits will improve Outcome: Progressing Goal: Respiratory complications will improve Outcome: Progressing Goal: Cardiovascular complication will be avoided Outcome: Progressing   Problem: Activity: Goal: Risk for activity intolerance will decrease Outcome: Progressing   Problem: Nutrition: Goal: Adequate nutrition will be maintained Outcome: Progressing   Problem: Coping: Goal: Level of anxiety will decrease Outcome: Progressing   Problem: Elimination: Goal: Will not experience complications related to bowel motility Outcome: Progressing

## 2021-05-03 NOTE — Evaluation (Signed)
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  8/4 s/p flexible video fiberoptic bronchocopy and biopsies, VATS for L upper lobectomy, and 8/5 for VATS explaration and drainage of hemothorax.  CT placed.  PMHx:  COPD, HLD,  Clinical Impression  Pt admitted with/for surgical management of potential L Lung malignancy, s/p VATS with complication of bleed.  Pt needing min to min guard assist for basic mobility and gait.  Pt currently limited functionally due to the problems listed. ( See problems list.)   Pt will benefit from PT to maximize function and safety in order to get ready for next venue listed below.     Follow Up Recommendations SNF;Supervision/Assistance - 24 hour    Equipment Recommendations  Rolling walker with 5" wheels    Recommendations for Other Services       Precautions / Restrictions Precautions Precautions: Fall      Mobility  Bed Mobility Overal bed mobility: Needs Assistance Bed Mobility: Supine to Sit;Sit to Supine     Supine to sit: Min guard Sit to supine: Min guard   General bed mobility comments: slow and guarded from virtually flat bed.    Transfers Overall transfer level: Needs assistance   Transfers: Sit to/from Stand Sit to Stand: Min assist         General transfer comment: cues for hand placement, stability assist  Ambulation/Gait Ambulation/Gait assistance: Min assist Gait Distance (Feet): 120 Feet Assistive device: Rolling walker (2 wheeled) Gait Pattern/deviations: Step-through pattern   Gait velocity interpretation: <1.8 ft/sec, indicate of risk for recurrent falls General Gait Details: slow, short, steady steps.  flexed posture, VSS overall on RA, pt moaning at end of gait trial in  pain.  Stairs            Wheelchair Mobility    Modified Rankin (Stroke Patients Only)       Balance Overall balance assessment: Needs assistance Sitting-balance support: No upper extremity supported Sitting balance-Leahy Scale: Fair     Standing balance support: Bilateral upper extremity supported;No upper extremity supported Standing balance-Leahy Scale: Fair                               Pertinent Vitals/Pain Pain Assessment: Faces Faces Pain Scale: Hurts even more Pain Location: thoracotomy incision. Pain Descriptors / Indicators: Discomfort;Grimacing;Guarding Pain Intervention(s): Monitored during session    Home Living Family/patient expects to be discharged to:: Private residence Living Arrangements: Alone   Type of Home: Mobile home Home Access: Stairs to enter Entrance Stairs-Rails: Psychiatric nurse of Steps: 4 Home Layout: One level Home Equipment: Tub bench;Bedside commode;Cane - single point      Prior Function Level of Independence: Independent;Independent with assistive device(s)         Comments: occasional use of the cane     Hand Dominance        Extremity/Trunk Assessment   Upper Extremity Assessment Upper Extremity Assessment: Overall WFL for tasks assessed    Lower Extremity Assessment Lower Extremity Assessment: Overall WFL for tasks assessed    Cervical / Trunk Assessment Cervical / Trunk Assessment: Kyphotic  Communication   Communication: No difficulties  Cognition Arousal/Alertness: Awake/alert Behavior During Therapy: WFL for tasks assessed/performed Overall Cognitive Status: Within Functional Limits  for tasks assessed                                        General Comments      Exercises     Assessment/Plan    PT Assessment Patient needs continued PT services  PT Problem List Decreased activity tolerance;Decreased mobility;Decreased knowledge of use of  DME;Cardiopulmonary status limiting activity;Pain       PT Treatment Interventions DME instruction;Gait training;Stair training;Functional mobility training;Therapeutic activities;Balance training;Patient/family education    PT Goals (Current goals can be found in the Care Plan section)  Acute Rehab PT Goals Patient Stated Goal: ultimately get home independent PT Goal Formulation: With patient Time For Goal Achievement: 05/17/21 Potential to Achieve Goals: Good    Frequency Min 3X/week   Barriers to discharge        Co-evaluation               AM-PAC PT "6 Clicks" Mobility  Outcome Measure Help needed turning from your back to your side while in a flat bed without using bedrails?: A Little Help needed moving from lying on your back to sitting on the side of a flat bed without using bedrails?: A Little Help needed moving to and from a bed to a chair (including a wheelchair)?: A Little Help needed standing up from a chair using your arms (e.g., wheelchair or bedside chair)?: A Little Help needed to walk in hospital room?: A Little Help needed climbing 3-5 steps with a railing? : A Lot 6 Click Score: 17    End of Session   Activity Tolerance: Patient tolerated treatment well;Patient limited by pain Patient left: in bed;with call bell/phone within reach Nurse Communication: Mobility status PT Visit Diagnosis: Unsteadiness on feet (R26.81);Other abnormalities of gait and mobility (R26.89);Pain Pain - part of body:  (L flank VATS site.)    Time: 6759-1638 PT Time Calculation (min) (ACUTE ONLY): 30 min   Charges:   PT Evaluation $PT Eval Moderate Complexity: 1 Mod PT Treatments $Gait Training: 8-22 mins        05/03/2021  Ginger Carne., PT Acute Rehabilitation Services [phone]  (pager) [phone]  (office)  Tessie Fass Jameison Haji 05/03/2021, 5:15 PM

## 2021-05-03 NOTE — Progress Notes (Signed)
     [address]       [place], [address]             [phone]      No events  Vitals:   05/03/21 0413 05/03/21 0700  BP: (!) 112/58 (!) 94/52  Pulse: 73 73  Resp: 18 19  Temp: 98 F (36.7 C) 98.1 F (36.7 C)  SpO2: 92% 95%   Alert NAD Sinus EWOB.  No leak on CT.  Good tidaling  CXR and labs pending  POD 3 s/p LUL, POD 2 s/p washout  Doing well CXR today.  If clear will remove CT Continue pulm hygiene. Will need to assistance at discharge  Lajuana Matte

## 2021-05-04 ENCOUNTER — Inpatient Hospital Stay (HOSPITAL_COMMUNITY): Payer: No Typology Code available for payment source

## 2021-05-04 LAB — TYPE AND SCREEN
ABO/RH(D): O POS
Antibody Screen: NEGATIVE
Unit division: 0
Unit division: 0
Unit division: 0
Unit division: 0
Unit division: 0
Unit division: 0

## 2021-05-04 LAB — BPAM RBC
Blood Product Expiration Date: 202209032359
Blood Product Expiration Date: 202209032359
Blood Product Expiration Date: 202209062359
Blood Product Expiration Date: 202209062359
Blood Product Expiration Date: 202209062359
Blood Product Expiration Date: 202209062359
ISSUE DATE / TIME: 202208042323
ISSUE DATE / TIME: 202208050652
ISSUE DATE / TIME: 202208050852
ISSUE DATE / TIME: 202208050852
Unit Type and Rh: 5100
Unit Type and Rh: 5100
Unit Type and Rh: 5100
Unit Type and Rh: 5100
Unit Type and Rh: 5100
Unit Type and Rh: 5100

## 2021-05-04 NOTE — NC FL2 (Signed)
[place] MEDICAID FL2 LEVEL OF CARE SCREENING TOOL     IDENTIFICATION  Patient Name: Kurt Bolton Birthdate: August 22, 1946 Sex: male Admission Date (Current Location): 04/30/2021  [facility] and [place] Number:  Herbalist and Address:  [facility] [place] [place] 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   Lung nodule 04/09/2021    Orientation RESPIRATION BLADDER Height & Weight     Self, Time, Situation, Place  Normal Continent Weight: 140 lb 14 oz (63.9 kg) Height:  6' (182.9 cm)  BEHAVIORAL SYMPTOMS/MOOD NEUROLOGICAL BOWEL NUTRITION STATUS      Continent Diet (See d/c summary)  AMBULATORY STATUS COMMUNICATION OF NEEDS Skin   Extensive Assist Verbally Surgical wounds (Incision chest)                       Personal Care Assistance Level of Assistance  Bathing, Feeding, Dressing Bathing Assistance: Limited assistance Feeding assistance: Independent Dressing Assistance: Limited assistance     Functional Limitations Info  Sight, Hearing, Speech Sight Info: Impaired Hearing Info: Adequate Speech Info: Adequate    SPECIAL CARE FACTORS FREQUENCY  PT (By licensed PT), OT (By licensed OT)     PT Frequency: 5x/week OT Frequency: 5x/week            Contractures Contractures Info: Not present    Additional Factors Info  Code Status, Allergies, Psychotropic, Insulin Sliding Scale Code Status Info: Full code Allergies Info: No known  allergies Psychotropic Info: see d/c summary Insulin Sliding Scale Info: see d/c summary       Current Medications (05/04/2021):  This is the current hospital active medication list Current Facility-Administered Medications  Medication Dose Route Frequency Provider Last Rate Last Admin   0.9 %  sodium chloride infusion   Intra-arterial PRN Jadene Pierini E, PA-C 10 mL/hr at 05/01/21 0300 Restarted at 05/01/21 0842   acetaminophen (TYLENOL) tablet 1,000 mg  1,000 mg Oral Q6H Gold, Wayne E, PA-C   1,000 mg at 05/04/21 1109   Or   acetaminophen (TYLENOL) 160 MG/5ML solution 1,000 mg  1,000 mg Oral Q6H Gold, Wayne E, PA-C       aspirin EC tablet 325 mg  325 mg Oral Daily Gold, Wayne E, PA-C   325 mg at 05/04/21 1109   atorvastatin (LIPITOR) tablet 40 mg  40 mg Oral QPM Gold, Wayne E, PA-C   40 mg at 05/03/21 1752   bisacodyl (DULCOLAX) EC tablet 10 mg  10 mg Oral Daily Gold, Wayne E, PA-C   10 mg at 05/04/21 1109   ketorolac (TORADOL) 15 MG/ML injection 15 mg  15 mg Intravenous Q6H PRN Gold, Wayne E, PA-C   15 mg at 05/04/21 0626   morphine 2 MG/ML injection 2 mg  2 mg Intravenous Q3H PRN Lajuana Matte, MD   2 mg at 05/04/21 0218   ondansetron (ZOFRAN) injection 4 mg  4 mg Intravenous Q6H PRN Jadene Pierini E, PA-C   4  mg at 05/01/21 0454   polyethylene glycol (MIRALAX / GLYCOLAX) packet 17 g  17 g Oral Daily Lajuana Matte, MD   17 g at 05/04/21 1110   senna-docusate (Senokot-S) tablet 1 tablet  1 tablet Oral QHS Gold, Patrick Jupiter E, PA-C   1 tablet at 05/03/21 2100   traMADol (ULTRAM) tablet 50 mg  50 mg Oral Q6H PRN Jadene Pierini E, PA-C   50 mg at 05/03/21 0802     Discharge Medications: Please see discharge summary for a list of discharge medications.  Relevant Imaging Results:  Relevant Lab Results:   Additional Information SSN [id number] Pt is covid vaccinated with 1 booster  [place], LCSW

## 2021-05-04 NOTE — TOC Initial Note (Addendum)
Transition of Care [facility]) - Initial/Assessment Note    Patient Details  Name: Kurt Bolton MRN: [id number] Date of Birth: 1945/11/17  Transition of [facility]) CM/SW Contact:    Kurt Bolton, [place] Phone Number: 05/04/2021, 2:24 PM  Clinical Narrative:                  Met with pt to discuss disposition. He lives in a mobile home in [place]. Reports he has no family or friends in the area. There is a Kurt Bolton listed in chart which pt states is his [name]. Pt reports he does not have medicare because he did not work enough throughout his life to qualify. Pt is agreeable to SNF workup. CSW completed Fl2 and faxed bed requests in hub.   CSW faxed [facility] Screening for SNF approval to [place].   Expected Discharge Plan: Skilled Nursing Facility Barriers to Discharge: Continued Medical Work up   Patient Goals and CMS Choice Patient states their goals for this hospitalization and ongoing recovery are:: SNF Rehab   Choice offered to / list presented to : Patient  Expected Discharge Plan and Services Expected Discharge Plan: [place]       Living arrangements for the past 2 months: Mobile Home                                      Prior Living Arrangements/Services Living arrangements for the past 2 months: Mobile Home Lives with:: Self   Do you feel safe going back to the place where you live?: Yes      Need for Family Participation in Patient Care: No (Comment) Care giver support system in place?: No (comment)      Activities of Daily Living Home Assistive Devices/Equipment: Eyeglasses ADL Screening (condition at time of admission) Patient's cognitive ability adequate to safely complete daily activities?: Yes Is the patient deaf or have difficulty hearing?: No Does the patient have difficulty seeing, even when wearing glasses/contacts?: No Does the patient have difficulty concentrating, remembering, or making decisions?:  No Patient able to express need for assistance with ADLs?: Yes Does the patient have difficulty dressing or bathing?: No Independently performs ADLs?: Yes (appropriate for developmental age) Does the patient have difficulty walking or climbing stairs?: No Weakness of Legs: None Weakness of Arms/Hands: None  Permission Sought/Granted   Permission granted to share information with : Yes, Verbal Permission Granted  Share Information with NAME: Kurt Bolton (Other)   [phone] (Work Phone)           Emotional Assessment Appearance:: Appears stated age Attitude/Demeanor/Rapport: Engaged Affect (typically observed): Accepting Orientation: : Oriented to Self, Oriented to Place, Oriented to  Time, Oriented to Situation Alcohol / Substance Use: Not Applicable Psych Involvement: No (comment)  Admission diagnosis:  Lung nodule [R91.1] S/P lobectomy of lung [Z90.2] Patient Active Problem List   Diagnosis Date Noted   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 [place] [address] Phone: [phone] Fax: [phone]     Social Determinants of Health (SDOH) Interventions    Readmission Risk Interventions No flowsheet data found.

## 2021-05-04 NOTE — Progress Notes (Signed)
      [address]       [place] [address]             [phone]        CLINICAL DATA:  Follow-up pneumothorax   EXAM: CHEST  1 VIEW   COMPARISON:  Chest x-ray dated May 03, 2021   FINDINGS: Small left pneumothorax is decreased in size compared to prior exam. Left-sided chest tube in place.   Unchanged left apical consolidation and scattered calcified pulmonary nodules. No new parenchymal process. No large pleural effusion.   IMPRESSION: Decreased size of small left pneumothorax with left-sided chest tube in place.     Electronically Signed   By: Yetta Glassman MD   On: 05/04/2021 08:48  Plan: CXR reviewed, continue chest tube to water seal.   Nicholes Rough, PA-C

## 2021-05-04 NOTE — Progress Notes (Signed)
Mobility Specialist: Progress Note   05/04/21 1557  Mobility  Activity Ambulated in hall  Level of Assistance Minimal assist, patient does 75% or more  Assistive Device Front wheel walker  Distance Ambulated (ft) 110 ft  Mobility Ambulated with assistance in hallway  Mobility Response Tolerated well  Mobility performed by Mobility specialist  $Mobility charge 1 Mobility   Pre-Mobility: 76 HR, 135/82 BP, 97% SpO2 Post-Mobility: 72 HR, 146/76 BP, 98% SpO2  Pt independent with bed mobility but required minA to stand from EOB. Pt moaning and c/o pain from chest tube during ambulation, otherwise asx. Pt back to bed after walk with call bell and phone at his side.   [facility] Kurt Bolton Mobility Specialist Mobility Specialist Phone: [phone]

## 2021-05-04 NOTE — Progress Notes (Signed)
      [address]       [place], [address]             [phone]      3 Days Post-Op Procedure(s) (LRB): VIDEO ASSISTED THORACOSCOPY (Left) Subjective: Having some pain around the chest tube site and on the left side.   Objective: Vital signs in last 24 hours: Temp:  [97.8 F (36.6 C)-98 F (36.7 C)] 97.8 F (36.6 C) (08/08 0622) Pulse Rate:  [74-78] 78 (08/08 0622) Cardiac Rhythm: Normal sinus rhythm (08/08 0701) Resp:  [16-20] 16 (08/07 2356) BP: (125-135)/(61-82) 135/78 (08/08 0622) SpO2:  [95 %-98 %] 95 % (08/08 0622)     Intake/Output from previous day: 08/07 0701 - 08/08 0700 In: -  Out: 750 [Urine:500; Chest Tube:250] Intake/Output this shift: No intake/output data recorded.  General appearance: alert, cooperative, and no distress Heart: regular rate and rhythm, S1, S2 normal, no murmur, click, rub or gallop Lungs: clear to auscultation bilaterally and friction rub from the chest tube Abdomen: soft, non-tender; bowel sounds normal; no masses,  no organomegaly Extremities: extremities normal, atraumatic, no cyanosis or edema Wound: clean and dry  Lab Results: Recent Labs    05/02/21 0200 05/03/21 0914  WBC 16.3* 13.3*  HGB 8.4* 8.3*  HCT 24.8* 24.5*  PLT PLATELET CLUMPS NOTED ON SMEAR, UNABLE TO ESTIMATE 126*   BMET:  Recent Labs    05/01/21 1006 05/02/21 0200  NA 138 135  K 4.7 4.2  CL  --  108  CO2  --  24  GLUCOSE  --  109*  BUN  --  31*  CREATININE  --  0.93  CALCIUM  --  8.1*    PT/INR: No results for input(s): LABPROT, INR in the last 72 hours. ABG    Component Value Date/Time   PHART 7.258 (L) 05/01/2021 1006   HCO3 23.3 05/01/2021 1006   TCO2 25 05/01/2021 1006   ACIDBASEDEF 4.0 (H) 05/01/2021 1006   O2SAT 99.0 05/01/2021 1006   CBG (last 3)  No results for input(s): GLUCAP in the last 72 hours.  Assessment/Plan: S/P Procedure(s) (LRB): VIDEO ASSISTED THORACOSCOPY (Left)  CV-NSR in the 70s, BP well  controlled. Continue asa and statin Pulm- tolerating room air with good saturation. Awaiting this mornings CXR Renal-creatinine 0.93, electrolytes okay H and H stable 8.3/24.5, expected acute blood loss anemia Glucose well controlled Pain not well controlled. He has Toradol and Morphine available.  Plan: Encouraged to ambulate this morning, working on pain control. Will leave the chest tube in for now with large air leak. Await CXR from this morning. Encouraged incentive spirometer.   LOS: 4 days    Elgie Collard 05/04/2021

## 2021-05-05 ENCOUNTER — Inpatient Hospital Stay (HOSPITAL_COMMUNITY): Payer: No Typology Code available for payment source

## 2021-05-05 LAB — SURGICAL PATHOLOGY

## 2021-05-05 LAB — CYTOLOGY - NON PAP

## 2021-05-05 NOTE — Plan of Care (Signed)
  Problem: Education: Goal: Knowledge of General Education information will improve Description: Including pain rating scale, medication(s)/side effects and non-pharmacologic comfort measures Outcome: Progressing   Problem: Activity: Goal: Risk for activity intolerance will decrease Outcome: Progressing   Problem: Nutrition: Goal: Adequate nutrition will be maintained Outcome: Progressing   Problem: Coping: Goal: Level of anxiety will decrease Outcome: Progressing   Problem: Pain Managment: Goal: General experience of comfort will improve Outcome: Progressing   Problem: Activity: Goal: Risk for activity intolerance will decrease Outcome: Progressing

## 2021-05-05 NOTE — TOC Progression Note (Signed)
Transition of Care [facility]) - Progression Note    Patient Details  Name: Kurt Bolton MRN: [id number] Date of Birth: [birth year]  Transition of Care [facility]) CM/SW [place], [place] Phone Number: 05/05/2021, 12:53 PM  Clinical Narrative:     CSW received call from [facility] liaison. Is informed that they may be able to offer LTACH bed on pt if chest tube is likely to be in for a while. They have contract with [facility]; would need [facility] approval.   Expected Discharge Plan: [place] Barriers to Discharge: Continued Medical Work up  Expected Discharge Plan and Services Expected Discharge Plan: [place] arrangements for the past 2 months: Mobile Home                                       Social Determinants of Health (SDOH) Interventions    Readmission Risk Interventions No flowsheet data found.

## 2021-05-05 NOTE — Progress Notes (Addendum)
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  8/4 s/p flexible video fiberoptic bronchocopy and biopsies, VATS for L upper lobectomy, and 8/5 for VATS explaration and drainage of hemothorax.  CT placed.  PMHx:  COPD, HLD,    PT Comments    Pt continues to be limited by pain, decreased safety and poor posture limiting pulmonary function. Pt educated for progression, gait and HEP as well as breathing technique and function. Pt requires assist for all mobility and continues to moan throughout session stating pain unchanged at 5/10 throughout and reports moaning helps him feel better. Pt encouraged to be OOB for meals.   HR 92-97 SpO2 91-98% on RA    Follow Up Recommendations  SNF;Supervision/Assistance - 24 hour     Equipment Recommendations  Rolling walker with 5" wheels    Recommendations for Other Services       Precautions / Restrictions Precautions Precautions: Fall Precaution Comments: chest tube left    Mobility  Bed Mobility Overal bed mobility: Needs Assistance Bed Mobility: Supine to Sit     Supine to sit: Min guard;HOB elevated     General bed mobility comments: HOB 35 degrees, guarding for CT and safety with increased time    Transfers Overall transfer level: Needs assistance   Transfers: Sit to/from Stand Sit to Stand: Min guard         General transfer comment: cues for hand placement, stability assist, flexed trunk in standing 96% on RA. pt moaning with each step and flexing into RW but reports no change in pain just preference for moaning  Ambulation/Gait Ambulation/Gait assistance: Min assist Gait Distance (Feet): 90 Feet Assistive device: Rolling walker (2 wheeled) Gait Pattern/deviations: Trunk flexed;Step-through  pattern;Decreased stride length   Gait velocity interpretation: <1.8 ft/sec, indicate of risk for recurrent falls General Gait Details: cues for pushing not lifting RW, cues for posture, direction, safety and to avoid obstacles. Pt walked 12' and reported dizziness with seated rest and BP 145/86 with HR 95 and SPO2 98%. 2nd trial 90'. No change in pain with gait despite moaning   Stairs             Wheelchair Mobility    Modified Rankin (Stroke Patients Only)       Balance Overall balance assessment: Needs assistance Sitting-balance support: No upper extremity supported Sitting balance-Leahy Scale: Fair Sitting balance - Comments: EOB without support, kyphotic   Standing balance support: Bilateral upper extremity supported Standing balance-Leahy Scale: Poor Standing balance comment: RW for gait                            Cognition Arousal/Alertness: Awake/alert Behavior During Therapy: WFL for tasks assessed/performed Overall Cognitive Status: Within Functional Limits for tasks assessed                                 General Comments: pt moaning despite awareness that it makes assessing pt challenging. able to answer orientation and money management questions appropriately      Exercises General Exercises - Lower Extremity Ankle Circles/Pumps: AROM;Both;Seated;10 reps Long Arc Quad: AROM;Both;Seated;10 reps Hip Flexion/Marching: AROM;Both;Standing;10 reps    General Comments  Pertinent Vitals/Pain Pain Assessment: 0-10 Pain Score: 5  Pain Location: left chest Pain Descriptors / Indicators: Aching;Guarding;Moaning Pain Intervention(s): Limited activity within patient's tolerance;Monitored during session;Repositioned    Home Living                      Prior Function            PT Goals (current goals can now be found in the care plan section) Progress towards PT goals: Progressing toward goals     Frequency    Min 3X/week      PT Plan Current plan remains appropriate    Co-evaluation              AM-PAC PT "6 Clicks" Mobility   Outcome Measure  Help needed turning from your back to your side while in a flat bed without using bedrails?: A Little Help needed moving from lying on your back to sitting on the side of a flat bed without using bedrails?: A Little Help needed moving to and from a bed to a chair (including a wheelchair)?: A Little Help needed standing up from a chair using your arms (e.g., wheelchair or bedside chair)?: A Little Help needed to walk in hospital room?: A Little Help needed climbing 3-5 steps with a railing? : A Lot 6 Click Score: 17    End of Session Equipment Utilized During Treatment: Gait belt Activity Tolerance: Patient tolerated treatment well Patient left: in chair;with call bell/phone within reach;with chair alarm set Nurse Communication: Mobility status PT Visit Diagnosis: Unsteadiness on feet (R26.81);Other abnormalities of gait and mobility (R26.89);Pain;Difficulty in walking, not elsewhere classified (R26.2);Muscle weakness (generalized) (M62.81)     Time: 6629-4765 PT Time Calculation (min) (ACUTE ONLY): 34 min  Charges:  $Gait Training: 8-22 mins $Therapeutic Activity: 8-22 mins                     Shiori Adcox P, PT Acute Rehabilitation Services Pager: [phone] Office: [place] 05/05/2021, 9:27 AM

## 2021-05-05 NOTE — TOC Progression Note (Addendum)
Transition of Care [facility]) - Progression Note    Patient Details  Name: Kurt Bolton MRN: [id number] Date of Birth: 16-Dec-1945  Transition of Care [facility]) CM/SW [place], [place] Phone Number: 05/05/2021, 1:58 PM  Clinical Narrative:     CSW received call from [place]. Pt is approved for SNF with contracted facility for 32 days. Pt has 30 days to use admit to facility. Pt is only approved for the following 8 facilities:   -[facility]; [place]; [place]; [place]; [place]; [place]; [place] [facility] [place]); [facility]   Expected Discharge Plan: [place] Barriers to Discharge: Continued Medical Work up  Expected Discharge Plan and Services Expected Discharge Plan: [place] arrangements for the past 2 months: Mobile Home                                       Social Determinants of Health (SDOH) Interventions    Readmission Risk Interventions No flowsheet data found.

## 2021-05-05 NOTE — Progress Notes (Addendum)
      [address]       [place] [address]             [phone]      4 Days Post-Op Procedure(s) (LRB): VIDEO ASSISTED THORACOSCOPY (Left) Subjective: Feels okay this morning, pain is better and more intermittent today.   Objective: Vital signs in last 24 hours: Temp:  [97.8 F (36.6 C)-98.5 F (36.9 C)] 97.8 F (36.6 C) (08/09 0717) Pulse Rate:  [64-73] 64 (08/09 0717) Cardiac Rhythm: Normal sinus rhythm (08/09 0658) Resp:  [17-19] 17 (08/09 0717) BP: (131-136)/(55-83) 136/67 (08/09 0717) SpO2:  [96 %-99 %] 96 % (08/09 0717)    Intake/Output from previous day: 08/08 0701 - 08/09 0700 In: 60 [P.O.:60] Out: 665 [Urine:450; Chest Tube:215] Intake/Output this shift: No intake/output data recorded.  General appearance: alert, cooperative, and no distress Heart: regular rate and rhythm, S1, S2 normal, no murmur, click, rub or gallop Lungs: clear to auscultation bilaterally and chest tube rub present Abdomen: soft, non-tender; bowel sounds normal; no masses,  no organomegaly Extremities: extremities normal, atraumatic, no cyanosis or edema Wound: clean and dry  Lab Results: Recent Labs    05/03/21 0914  WBC 13.3*  HGB 8.3*  HCT 24.5*  PLT 126*   BMET: No results for input(s): NA, K, CL, CO2, GLUCOSE, BUN, CREATININE, CALCIUM in the last 72 hours.  PT/INR: No results for input(s): LABPROT, INR in the last 72 hours. ABG    Component Value Date/Time   PHART 7.258 (L) 05/01/2021 1006   HCO3 23.3 05/01/2021 1006   TCO2 25 05/01/2021 1006   ACIDBASEDEF 4.0 (H) 05/01/2021 1006   O2SAT 99.0 05/01/2021 1006   CBG (last 3)  No results for input(s): GLUCAP in the last 72 hours.  Assessment/Plan: S/P Procedure(s) (LRB): VIDEO ASSISTED THORACOSCOPY (Left)  CV-NSR in the 70s, BP well controlled. Continue asa and statin Pulm- tolerating room air with good saturation. Awaiting this mornings CXR Renal-creatinine 0.93, electrolytes okay H and H stable  8.3/24.5, expected acute blood loss anemia Glucose well controlled Pain not well controlled. He has Toradol and Morphine available.  Plan: Planning for SNF placement. Work on ambulation around the unit today. Still has a 4+ air leak with cough. Incentive spirometer at the bedside and encouraged to use 10x an hour. CT to suction.    LOS: 5 days    Elgie Collard 05/05/2021   CT back to suction Continue pulm hygiene  [place]

## 2021-05-06 ENCOUNTER — Inpatient Hospital Stay (HOSPITAL_COMMUNITY): Payer: No Typology Code available for payment source

## 2021-05-06 NOTE — TOC Progression Note (Signed)
Transition of Care [facility]) - Progression Note    Patient Details  Name: Kurt Bolton MRN: [id number] Date of Birth: Apr 25, 1946  Transition of Care [facility]) CM/SW [place], [place] Phone Number: 05/06/2021, 10:37 AM  Clinical Narrative:     CSW received call from [facility] notifying CSW that pt would need to sign a copay agreement for SNF approval. She will email agreement to [place]. This CSW notified [place].   Expected Discharge Plan: [place] Barriers to Discharge: Continued Medical Work up  Expected Discharge Plan and Services Expected Discharge Plan: [place] arrangements for the past 2 months: Mobile Home                                       Social Determinants of Health (SDOH) Interventions    Readmission Risk Interventions No flowsheet data found.

## 2021-05-06 NOTE — TOC Progression Note (Addendum)
Transition of Care [facility]) - Progression Note    Patient Details  Name: Kurt Bolton MRN: [id number] Date of Birth: 02-Mar-1946  [facility]) CM/SW Contact  Reece Agar, [place] Phone Number: 05/06/2021, 10:09 AM  Clinical Narrative:     CSW reached out to Theressa Millard, pt [facility] case worker for information on getting approval for SNF out of VA network. She directed CSW to Sherald Barge [phone]) pt [facility]/SW who explained that [facility] is out of network but she will follow up with their billing department and follow up with CSW if they are able to get the auth approved. Pt has been approved for 30 days to any in network facility, if [facility] cannot get approved pt will choose another facility listed in previous CSW note.  CSW received an e-mail for pt co-pay agreement from [place] insurance for pt to complete before DC. CSW gave pt the agreement and explained what it was. Pt wants to contact his case worker at the [place] to help him decide on what to do bc he does not want to pay a co-pay. CSW left form with pt to read over. Pt will follow up with CSW after speaking with case worker.  Expected Discharge Plan: [place] Barriers to Discharge: Continued Medical Work up  Expected Discharge Plan and Services Expected Discharge Plan: [place] arrangements for the past 2 months: Mobile Home                                       Social Determinants of Health (SDOH) Interventions    Readmission Risk Interventions No flowsheet data found.

## 2021-05-06 NOTE — Progress Notes (Addendum)
      [address]       [place] [address]             [phone]      5 Days Post-Op Procedure(s) (LRB): VIDEO ASSISTED THORACOSCOPY (Left) Subjective: Having some pain around the chest tube site. Otherwise walked once yesterday and trying to eat his eggs and bacon this morning.  Objective: Vital signs in last 24 hours: Temp:  [97.8 F (36.6 C)-98.3 F (36.8 C)] 98.3 F (36.8 C) (08/10 0300) Pulse Rate:  [67-99] 67 (08/10 0300) Cardiac Rhythm: Normal sinus rhythm (08/09 1907) Resp:  [16-25] 20 (08/10 0300) BP: (105-139)/(59-72) 137/70 (08/10 0300) SpO2:  [91 %-97 %] 96 % (08/10 0300)     Intake/Output from previous day: 08/09 0701 - 08/10 0700 In: 120 [P.O.:120] Out: 1420 [Urine:1250; Chest Tube:170] Intake/Output this shift: Total I/O In: -  Out: 350 [Urine:350]  General appearance: alert, cooperative, and no distress Heart: regular rate and rhythm, S1, S2 normal, no murmur, click, rub or gallop Lungs: clear to auscultation bilaterally Abdomen: soft, non-tender; bowel sounds normal; no masses,  no organomegaly Extremities: extremities normal, atraumatic, no cyanosis or edema Wound: clean and dry  Lab Results: Recent Labs    05/03/21 0914  WBC 13.3*  HGB 8.3*  HCT 24.5*  PLT 126*   BMET: No results for input(s): NA, K, CL, CO2, GLUCOSE, BUN, CREATININE, CALCIUM in the last 72 hours.  PT/INR: No results for input(s): LABPROT, INR in the last 72 hours. ABG    Component Value Date/Time   PHART 7.258 (L) 05/01/2021 1006   HCO3 23.3 05/01/2021 1006   TCO2 25 05/01/2021 1006   ACIDBASEDEF 4.0 (H) 05/01/2021 1006   O2SAT 99.0 05/01/2021 1006   CBG (last 3)  No results for input(s): GLUCAP in the last 72 hours.  Assessment/Plan: S/P Procedure(s) (LRB): VIDEO ASSISTED THORACOSCOPY (Left)  CV-NSR in the 70s, BP well controlled. Continue asa and statin Pulm- tolerating room air with good saturation.CXR this morning shows decreased pneumo.  Chest tube placed back to suction yesterday due to increased pneumo Renal-creatinine 0.93, electrolytes okay H and H stable 8.3/24.5, expected acute blood loss anemia Glucose well controlled Pain not well controlled. He has Toradol and Morphine available.   Plan: Planning for SNF placement. Continue to encourage ambulation in the halls. Working on pain control. Encouraged more oral intake for wound healing. CXR better this morning-may be able to try water seal today.   LOS: 6 days    Elgie Collard 05/06/2021   Agree with above. Airleak remains present but the chest x-ray is much improved. I will keep to suction for 1 more day. Pulmonary hygiene.  Carman Auxier [name]

## 2021-05-07 ENCOUNTER — Inpatient Hospital Stay (HOSPITAL_COMMUNITY): Payer: No Typology Code available for payment source

## 2021-05-07 LAB — CBC
HCT: 27.2 % — ABNORMAL LOW (ref 39.0–52.0)
Hemoglobin: 9.1 g/dL — ABNORMAL LOW (ref 13.0–17.0)
MCH: 31 pg (ref 26.0–34.0)
MCHC: 33.5 g/dL (ref 30.0–36.0)
MCV: 92.5 fL (ref 80.0–100.0)
Platelets: 246 10*3/uL (ref 150–400)
RBC: 2.94 MIL/uL — ABNORMAL LOW (ref 4.22–5.81)
RDW: 15 % (ref 11.5–15.5)
WBC: 12.9 10*3/uL — ABNORMAL HIGH (ref 4.0–10.5)
nRBC: 0 % (ref 0.0–0.2)

## 2021-05-07 LAB — BASIC METABOLIC PANEL
Anion gap: 7 (ref 5–15)
BUN: 14 mg/dL (ref 8–23)
CO2: 24 mmol/L (ref 22–32)
Calcium: 8.4 mg/dL — ABNORMAL LOW (ref 8.9–10.3)
Chloride: 103 mmol/L (ref 98–111)
Creatinine, Ser: 0.68 mg/dL (ref 0.61–1.24)
GFR, Estimated: >60 mL/min (ref >60)
Glucose, Bld: 103 mg/dL — ABNORMAL HIGH (ref 70–99)
Potassium: 3.8 mmol/L (ref 3.5–5.1)
Sodium: 134 mmol/L — ABNORMAL LOW (ref 135–145)

## 2021-05-07 NOTE — Plan of Care (Signed)
  Problem: Pain Managment: Goal: General experience of comfort will improve Outcome: Not Progressing   Problem: Pain Management: Goal: Pain level will decrease Outcome: Not Progressing

## 2021-05-07 NOTE — Progress Notes (Addendum)
      [address]       [place] [address]             [phone]      6 Days Post-Op Procedure(s) (LRB): VIDEO ASSISTED THORACOSCOPY (Left) Subjective: Feels okay this morning, pain is well controlled on the morphine  Objective: Vital signs in last 24 hours: Temp:  [97.8 F (36.6 C)-98.7 F (37.1 C)] 97.8 F (36.6 C) (08/11 0302) Pulse Rate:  [72-82] 75 (08/11 0302) Cardiac Rhythm: Normal sinus rhythm (08/11 0305) Resp:  [17-23] 19 (08/11 0302) BP: (100-124)/(57-72) 115/62 (08/11 0302) SpO2:  [96 %-98 %] 97 % (08/11 0302)     Intake/Output from previous day: 08/10 0701 - 08/11 0700 In: 240 [P.O.:240] Out: 1480 [Urine:1250; Chest Tube:230] Intake/Output this shift: No intake/output data recorded.  General appearance: alert, cooperative, and no distress Heart: regular rate and rhythm, S1, S2 normal, no murmur, click, rub or gallop Lungs: clear to auscultation bilaterally Abdomen: soft, non-tender; bowel sounds normal; no masses,  no organomegaly Extremities: extremities normal, atraumatic, no cyanosis or edema Wound: clean and dry  Lab Results: Recent Labs    05/07/21 0046  WBC 12.9*  HGB 9.1*  HCT 27.2*  PLT 246   BMET:  Recent Labs    05/07/21 0046  NA 134*  K 3.8  CL 103  CO2 24  GLUCOSE 103*  BUN 14  CREATININE 0.68  CALCIUM 8.4*    PT/INR: No results for input(s): LABPROT, INR in the last 72 hours. ABG    Component Value Date/Time   PHART 7.258 (L) 05/01/2021 1006   HCO3 23.3 05/01/2021 1006   TCO2 25 05/01/2021 1006   ACIDBASEDEF 4.0 (H) 05/01/2021 1006   O2SAT 99.0 05/01/2021 1006   CBG (last 3)  No results for input(s): GLUCAP in the last 72 hours.  Assessment/Plan: S/P Procedure(s) (LRB): VIDEO ASSISTED THORACOSCOPY (Left)  CV-NSR in the 70s, BP well controlled. Continue asa and statin Pulm- tolerating room air with good saturation.CXR much improved. Chest tube currently to suction. Renal-creatinine 0.68,  electrolytes okay H and H stable 9.1/27.2 expected acute blood loss anemia Glucose well controlled He has Toradol and Morphine available for pain  Plan: Will try water seal this morning. CXR in the morning. Encouraged to ambulate around the unit and use his incentive spirometer.     LOS: 7 days    Elgie Collard 05/07/2021  Agree with above CT to [place]

## 2021-05-07 NOTE — Progress Notes (Signed)
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  8/4 s/p flexible video fiberoptic bronchocopy and biopsies, VATS for L upper lobectomy, and 8/5 for VATS explaration and drainage of hemothorax.  CT placed.  PMHx:  COPD, HLD,    PT Comments    Pt received in supine, agreeable to therapy session and with fair participation. Primary focus on supine/seated LE exercises and stand pivot transfer to chair, pt medicated during session by RN due to c/o back pain but declined to attempt gait trial due to pain meds "not kicking in yet". Encouraged pt to ambulate with mobility tech later in day, mobility tech also notified to check in with him in ~an hour after premedication. Pt up in chair with alarm on for safety at end of session. Pt continues to benefit from PT services to progress toward functional mobility goals.    Follow Up Recommendations  SNF;Supervision/Assistance - 24 hour     Equipment Recommendations  Rolling walker with 5" wheels    Recommendations for Other Services       Precautions / Restrictions Precautions Precautions: Fall Precaution Comments: chest tube left Restrictions Weight Bearing Restrictions: No    Mobility  Bed Mobility Overal bed mobility: Needs Assistance Bed Mobility: Rolling;Sidelying to Sit Rolling: Min assist Sidelying to sit: Min assist       General bed mobility comments: cues for technique, pt needs multimodal cues and increased time due to distraction/slow processing and c/o increased pain so needed minA for trunk rise.    Transfers Overall transfer level: Needs assistance Equipment used: Rolling walker (2 wheeled) Transfers: Stand Pivot Transfers;Sit to/from Stand Sit to Stand: Min assist;From elevated surface Stand  pivot transfers: Min assist       General transfer comment: cues for hand placement, stability assist, flexed trunk in standing poor waveform during transfer, once seated SpO2 WNL on RA. pt moaning with each step and flexing into RW and c/o discomfort at CT site once seated (RN had just replaced dressing and no new drainage observed)  Ambulation/Gait                 Stairs             Wheelchair Mobility    Modified Rankin (Stroke Patients Only)       Balance Overall balance assessment: Needs assistance Sitting-balance support: No upper extremity supported Sitting balance-Leahy Scale: Fair Sitting balance - Comments: EOB without support, kyphotic Postural control: Other (comment) (preference for anterior lean) Standing balance support: Bilateral upper extremity supported Standing balance-Leahy Scale: Poor Standing balance comment: RW for gait, crouched posture                            Cognition Arousal/Alertness: Awake/alert Behavior During Therapy: WFL for tasks assessed/performed Overall Cognitive Status: No family/caregiver present to determine baseline cognitive functioning Area of Impairment: Attention;Safety/judgement;Problem solving                   Current Attention Level: Focused Memory: Decreased short-term memory   Safety/Judgement: Decreased awareness of safety;Decreased awareness of deficits   Problem Solving: Difficulty sequencing;Requires verbal cues General Comments: pt moaning despite awareness that it makes assessing pt challenging. able to answer orientation questions appropriately but frequently tangential and  decreased insight into importance of mobility.      Exercises General Exercises - Lower Extremity Ankle Circles/Pumps: AROM;Both;Seated;10 reps Long Arc Quad: AROM;Both;Seated;10 reps Hip Flexion/Marching: AROM;Both;10 reps;Seated Other Exercises Other Exercises: heel slides and straight leg raises x10 reps  AROM supine    General Comments General comments (skin integrity, edema, etc.): BP 115/60 pre-mobility and 124/74 post-transfer to chair; HR and SpO2 WNL on RA      Pertinent Vitals/Pain Pain Assessment: 0-10 Pain Score: 10-Worst pain ever Pain Location: left chest and back when coughing and with transfers; less at rest Pain Descriptors / Indicators: Aching;Guarding;Moaning Pain Intervention(s): Limited activity within patient's tolerance;Monitored during session;Repositioned;RN gave pain meds during session    Home Living                      Prior Function            PT Goals (current goals can now be found in the care plan section) Acute Rehab PT Goals Patient Stated Goal: ultimately get home independent PT Goal Formulation: With patient Time For Goal Achievement: 05/17/21 Potential to Achieve Goals: Good Progress towards PT goals: Progressing toward goals    Frequency    Min 3X/week      PT Plan Current plan remains appropriate    Co-evaluation              AM-PAC PT "6 Clicks" Mobility   Outcome Measure  Help needed turning from your back to your side while in a flat bed without using bedrails?: A Little Help needed moving from lying on your back to sitting on the side of a flat bed without using bedrails?: A Little Help needed moving to and from a bed to a chair (including a wheelchair)?: A Little Help needed standing up from a chair using your arms (e.g., wheelchair or bedside chair)?: A Little Help needed to walk in hospital room?: A Lot Help needed climbing 3-5 steps with a railing? : A Lot 6 Click Score: 16    End of Session Equipment Utilized During Treatment: Gait belt Activity Tolerance: Patient limited by pain Patient left: in chair;with call bell/phone within reach;with chair alarm set Nurse Communication: Mobility status PT Visit Diagnosis: Unsteadiness on feet (R26.81);Other abnormalities of gait and mobility  (R26.89);Pain;Difficulty in walking, not elsewhere classified (R26.2);Muscle weakness (generalized) (M62.81)     Time: 5945-8592 PT Time Calculation (min) (ACUTE ONLY): 33 min  Charges:  $Therapeutic Exercise: 8-22 mins $Therapeutic Activity: 8-22 mins                     Eldwin Volkov P., PTA Acute Rehabilitation Services Pager: [phone] Office: [place] 05/07/2021, 4:28 PM

## 2021-05-07 NOTE — Progress Notes (Signed)
Mobility Specialist: Progress Note   05/07/21 1822  Mobility  Activity Ambulated in room  Level of Assistance Contact guard assist, steadying assist  Assistive Device Front wheel walker  Distance Ambulated (ft) 20 ft  Mobility Ambulated with assistance in room  Mobility Response Tolerated fair  Mobility performed by Mobility specialist  $Mobility charge 1 Mobility   Pre-Mobility: 85 HR, 97% SpO2 Post-Mobility: 98 HR, 101/60 BP, 99% SpO2  Pt agreeable to ambulation but dizzy upon standing. Pt said it did not progress worse and continued. Pt able to ambulate around the end of the bed and needed to sit down d/t feeling dizzy, vitals as seen above. Pt back in bed after walk with call bell and phone at his side and bed alarm on.   [facility] Lynne Righi Mobility Specialist Mobility Specialist Phone: [phone]

## 2021-05-08 ENCOUNTER — Inpatient Hospital Stay (HOSPITAL_COMMUNITY): Payer: No Typology Code available for payment source

## 2021-05-08 ENCOUNTER — Ambulatory Visit: Payer: Self-pay | Admitting: Thoracic Surgery (Cardiothoracic Vascular Surgery)

## 2021-05-08 MED ORDER — MORPHINE SULFATE (PF) 2 MG/ML IV SOLN
2.0000 mg | INTRAVENOUS | Status: DC | PRN
  Administered 2021-05-09 – 2021-05-16 (×8): 2 mg via INTRAVENOUS
  Filled 2021-05-08 (×8): qty 1

## 2021-05-08 NOTE — Progress Notes (Signed)
Chest tube clamped per order.

## 2021-05-08 NOTE — Progress Notes (Signed)
      [address]       [place] [address]             [phone]      7 Days Post-Op Procedure(s) (LRB): VIDEO ASSISTED THORACOSCOPY (Left) Subjective: Feels okay this morning, asking for oat bran for his bowels. He didn't get very far with cardiac rehab yesterday due to hypotension and nausea.  Objective: Vital signs in last 24 hours: Temp:  [97.7 F (36.5 C)-98.3 F (36.8 C)] 98.3 F (36.8 C) (08/12 0336) Pulse Rate:  [75-92] 80 (08/12 0336) Cardiac Rhythm: Normal sinus rhythm (08/12 0336) Resp:  [15-24] 24 (08/12 0336) BP: (90-124)/(53-74) 102/53 (08/12 0336) SpO2:  [95 %-99 %] 96 % (08/12 0336)     Intake/Output from previous day: 08/11 0701 - 08/12 0700 In: 960 [P.O.:960] Out: 910 [Urine:700; Chest Tube:210] Intake/Output this shift: No intake/output data recorded.  General appearance: alert, cooperative, and no distress Heart: regular rate and rhythm, S1, S2 normal, no murmur, click, rub or gallop Lungs: clear to auscultation bilaterally Abdomen: soft, non-tender; bowel sounds normal; no masses,  no organomegaly Extremities: extremities normal, atraumatic, no cyanosis or edema Wound: clean and dry  Lab Results: Recent Labs    05/07/21 0046  WBC 12.9*  HGB 9.1*  HCT 27.2*  PLT 246   BMET:  Recent Labs    05/07/21 0046  NA 134*  K 3.8  CL 103  CO2 24  GLUCOSE 103*  BUN 14  CREATININE 0.68  CALCIUM 8.4*    PT/INR: No results for input(s): LABPROT, INR in the last 72 hours. ABG    Component Value Date/Time   PHART 7.258 (L) 05/01/2021 1006   HCO3 23.3 05/01/2021 1006   TCO2 25 05/01/2021 1006   ACIDBASEDEF 4.0 (H) 05/01/2021 1006   O2SAT 99.0 05/01/2021 1006   CBG (last 3)  No results for input(s): GLUCAP in the last 72 hours.  Assessment/Plan: S/P Procedure(s) (LRB): VIDEO ASSISTED THORACOSCOPY (Left)  CV-NSR in the 70s, BP well controlled. Continue asa and statin Pulm- tolerating room air with good saturation.CXR  much improved. Small residual apical pneumo. Chest tube is to water seal. Renal-creatinine 0.68, electrolytes okay H and H stable 9.1/27.2 expected acute blood loss anemia Glucose well controlled He has Toradol and Morphine available for pain. Will change morphine to q4 hours   Plan: Will discuss chest tube removal with Dr. Kipp Brood. CXR appears stable to me however, continued air leak with cough. Encouraged ambulation and hydration.    LOS: 8 days    Elgie Collard 05/08/2021

## 2021-05-08 NOTE — TOC Progression Note (Signed)
Transition of Care [facility]) - Progression Note    Patient Details  Name: Kurt Bolton MRN: [id number] Date of Birth: [birth year]  Transition of Care [facility]) CM/SW Contact  Reece Agar, [place] Phone Number: 05/08/2021, 3:36 PM  Clinical Narrative:    CSW attempted to contact [facility] coordinator, no answer CSW left a detailed message. CSW will continue to follow.   Expected Discharge Plan: [place] Barriers to Discharge: Continued Medical Work up  Expected Discharge Plan and Services Expected Discharge Plan: [place] arrangements for the past 2 months: Mobile Home                                       Social Determinants of Health (SDOH) Interventions    Readmission Risk Interventions No flowsheet data found.

## 2021-05-08 NOTE — Progress Notes (Signed)
      [address]       [place] [address]             [phone]        CXR reviewed. Clearly a larger pneumothorax after clamping especially in the lateral aspect. Will unclamp at this time and leave to water seal. Will await official read.   Plan: unclamp and leave to water seal. CXR in the morning. No new shortness of breath.

## 2021-05-08 NOTE — Plan of Care (Signed)
  Problem: Education: Goal: Knowledge of General Education information will improve Description: Including pain rating scale, medication(s)/side effects and non-pharmacologic comfort measures Outcome: Progressing   Problem: Health Behavior/Discharge Planning: Goal: Ability to manage health-related needs will improve Outcome: Progressing   Problem: Clinical Measurements: Goal: Ability to maintain clinical measurements within normal limits will improve Outcome: Progressing Goal: Will remain free from infection Outcome: Progressing Goal: Diagnostic test results will improve Outcome: Progressing Goal: Respiratory complications will improve Outcome: Progressing Goal: Cardiovascular complication will be avoided Outcome: Progressing   Problem: Activity: Goal: Risk for activity intolerance will decrease Outcome: Progressing   Problem: Nutrition: Goal: Adequate nutrition will be maintained Outcome: Progressing   Problem: Coping: Goal: Level of anxiety will decrease Outcome: Progressing   Problem: Elimination: Goal: Will not experience complications related to bowel motility Outcome: Progressing Goal: Will not experience complications related to urinary retention Outcome: Progressing   Problem: Pain Managment: Goal: General experience of comfort will improve Outcome: Progressing   Problem: Safety: Goal: Ability to remain free from injury will improve Outcome: Progressing   Problem: Skin Integrity: Goal: Risk for impaired skin integrity will decrease Outcome: Progressing   Problem: Education: Goal: Knowledge of disease or condition will improve Outcome: Progressing Goal: Knowledge of the prescribed therapeutic regimen will improve Outcome: Progressing   Problem: Safety: Goal: Ability to remain free from injury will improve Outcome: Progressing   Problem: Skin Integrity: Goal: Risk for impaired skin integrity will decrease Outcome: Progressing   Problem:  Education: Goal: Knowledge of disease or condition will improve Outcome: Progressing Goal: Knowledge of the prescribed therapeutic regimen will improve Outcome: Progressing   Problem: Activity: Goal: Risk for activity intolerance will decrease Outcome: Progressing   Problem: Cardiac: Goal: Will achieve and/or maintain hemodynamic stability Outcome: Progressing   Problem: Clinical Measurements: Goal: Postoperative complications will be avoided or minimized Outcome: Progressing   Problem: Pain Management: Goal: Pain level will decrease Outcome: Progressing   Problem: Skin Integrity: Goal: Wound healing without signs and symptoms infection will improve Outcome: Progressing

## 2021-05-08 NOTE — TOC Progression Note (Signed)
Transition of Care [facility]) - Progression Note    Patient Details  Name: Kurt Bolton MRN: [id number] Date of Birth: December 05, 1945  Transition [facility] Care [facility]) CM/SW Bolton, Kurt Bolton Phone Number: 05/08/2021, 4:15 PM  Clinical Narrative:     Kurt completed [facility] paper work with pt which will determine how much his SNF copay will be. Kurt provided completed paperwork to [facility]; she will email/fax paperwork to correct [place] worker to determine what pt's copay is.   Expected Discharge Plan: [place] Barriers to Discharge: Continued Medical Work up  Expected Discharge Plan and Services Expected Discharge Plan: [place] arrangements for the past 2 months: Mobile Home                                       Social Determinants of Health (SDOH) Interventions    Readmission Risk Interventions No flowsheet data found.

## 2021-05-08 NOTE — Progress Notes (Signed)
Chest tube unclamped per verbal order.

## 2021-05-08 NOTE — Progress Notes (Signed)
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  8/4 s/p flexible video fiberoptic bronchocopy and biopsies, VATS for L upper lobectomy, and 8/5 for VATS explaration and drainage of hemothorax.  CT placed 8/5 to wall suction, then on water seal 8/12.  PMHx:  COPD, HLD,    PT Comments    Pt received in supine, agreeable to therapy session and with good participation and fair tolerance for transfer training. Pt continues to demonstrate symptomatic drop in BP with standing tasks (see below) and unable to progress gait training this session so instead focused on seated exercises and transfer training. Pt may benefit from compression hose for improved hemodynamics in future sessions. Pt remains tangential and noted decreased memory from previous session activities. Pt continues to benefit from PT services to progress toward functional mobility goals.  Orthostatic BPs Sitting                      HR 87 107/64 (76)  Sitting after 3 min    HR 90 109/60 (75)  Standing         HR 100-110 78/61 (68)  Sitting            HR 80's bpm 112/67 (81)      Follow Up Recommendations  SNF;Supervision/Assistance - 24 hour     Equipment Recommendations  Rolling walker with 5" wheels    Recommendations for Other Services       Precautions / Restrictions Precautions Precautions: Fall Precaution Comments: orthostatic hypotension, chest tube left (to water seal 8/12) Restrictions Weight Bearing Restrictions: No    Mobility  Bed Mobility Overal bed mobility: Needs Assistance Bed Mobility: Rolling;Sidelying to Sit Rolling: Min guard Sidelying to sit: Min assist       General bed mobility comments: cues for technique, pt needs multimodal cues and increased time due to  distraction/slow processing and c/o increased pain so needed minA for trunk rise.    Transfers Overall transfer level: Needs assistance Equipment used: Rolling walker (2 wheeled) Transfers: Stand Pivot Transfers;Sit to/from Stand Sit to Stand: Min assist;From elevated surface Stand pivot transfers: Min assist       General transfer comment: cues for hand placement, stability assist, flexed trunk in standing poor waveform during transfer, once seated SpO2 WNL on RA. Pt lightly moaning this date and downward gaze/flexed trunk at RW, able to improve posture with cues but limited carryover of postural instructions; c/o dizziness  Ambulation/Gait     General Gait Details: UTA aside from pivotal steps to chair due to symptomatic orthostatic hypotension      Balance Overall balance assessment: Needs assistance Sitting-balance support: No upper extremity supported Sitting balance-Leahy Scale: Fair Sitting balance - Comments: EOB without support, kyphotic Postural control: Other (comment) (preference for anterior lean) Standing balance support: Bilateral upper extremity supported Standing balance-Leahy Scale: Poor Standing balance comment: RW for gait, crouched posture, dizziness in stance              Cognition Arousal/Alertness: Awake/alert Behavior During Therapy: Restless Overall Cognitive Status: No family/caregiver present to determine baseline cognitive functioning Area of Impairment: Attention;Safety/judgement;Problem solving;Memory                   Current Attention Level: Focused Memory: Decreased short-term memory  Safety/Judgement: Decreased awareness of safety;Decreased awareness of deficits   Problem Solving: Difficulty sequencing;Requires verbal cues General Comments: Pt able to answer orientation questions appropriately but frequently tangential and needs frequent redirection to task. Pt tending to hoard small objects (plastic bowls from lunch tray,  coffee mug, used straws) under his sheets/in the bed and asking staff not to remove from room.      Exercises General Exercises - Lower Extremity Ankle Circles/Pumps: AROM;Both;Seated;10 reps Long Arc Quad: AROM;Both;Seated;10 reps Hip Flexion/Marching: AROM;Both;10 reps;Seated Other Exercises Other Exercises: serial STS x 5 (increased time/cues) from chair    General Comments General comments (skin integrity, edema, etc.): BP 107/64 (76) seated EOB asymptomatic; BP 87/60 (69) standing at RW, c/o dizziness; BP 109/60 (75) sitting in chair; BP 78/61 (68) standing at RW second attempt, dizzy; BP 112/67 (81) seated in chair at end of session, RN notified of symptomatic BP drop in stance. Pt may benefit from TED/compression hose and encouraged him to eat/drink for improved hemodynamics.      Pertinent Vitals/Pain Pain Assessment: Faces Faces Pain Scale: Hurts little more Pain Location: left chest and back when coughing and with transfers; less at rest Pain Descriptors / Indicators: Aching;Guarding;Moaning Pain Intervention(s): Limited activity within patient's tolerance;Monitored during session;Premedicated before session;Repositioned     PT Goals (current goals can now be found in the care plan section) Acute Rehab PT Goals Patient Stated Goal: ultimately get home independent PT Goal Formulation: With patient Time For Goal Achievement: 05/17/21 Potential to Achieve Goals: Good Progress towards PT goals: Progressing toward goals (progress limited due to orthostatic hypotension today and previous date)    Frequency    Min 3X/week      PT Plan Current plan remains appropriate       AM-PAC PT "6 Clicks" Mobility   Outcome Measure  Help needed turning from your back to your side while in a flat bed without using bedrails?: A Little Help needed moving from lying on your back to sitting on the side of a flat bed without using bedrails?: A Little Help needed moving to and from a  bed to a chair (including a wheelchair)?: A Little Help needed standing up from a chair using your arms (e.g., wheelchair or bedside chair)?: A Little Help needed to walk in hospital room?: A Lot Help needed climbing 3-5 steps with a railing? : A Lot 6 Click Score: 16    End of Session Equipment Utilized During Treatment: Gait belt Activity Tolerance: Treatment limited secondary to medical complications (Comment);Other (comment) (symptomatic orthostatic hypotension) Patient left: in chair;with call bell/phone within reach;with chair alarm set Nurse Communication: Mobility status;Other (comment) (orthostatic hypotension, pt has chair pad activated for safety but no cord to transfer green box alarm to front secretary desk (device will make noise if pt gets up but alert will not show up at front desk, per RN she will be nearby so not an issue)) PT Visit Diagnosis: Unsteadiness on feet (R26.81);Other abnormalities of gait and mobility (R26.89);Pain;Difficulty in walking, not elsewhere classified (R26.2);Muscle weakness (generalized) (M62.81)     Time: 3710-6269 PT Time Calculation (min) (ACUTE ONLY): 36 min  Charges:  $Therapeutic Exercise: 8-22 mins $Therapeutic Activity: 8-22 mins                     Kisean Rollo P., PTA Acute Rehabilitation Services Pager: [phone] Office: [place] 05/08/2021, 12:24 PM

## 2021-05-09 ENCOUNTER — Inpatient Hospital Stay (HOSPITAL_COMMUNITY): Payer: No Typology Code available for payment source

## 2021-05-09 MED ORDER — POLYETHYLENE GLYCOL 3350 17 G PO PACK: 17.0000 g | PACK | Freq: Every day | ORAL | Status: DC

## 2021-05-09 MED ORDER — PSYLLIUM 95 % PO PACK
1.0000 | PACK | Freq: Every day | ORAL | Status: DC
  Administered 2021-05-09 – 2021-05-20 (×12): 1 via ORAL
  Filled 2021-05-09 (×12): qty 1

## 2021-05-09 NOTE — Progress Notes (Addendum)
      [address]       [place] [address]             [phone]       8 Days Post-Op Procedure(s) (LRB): VIDEO ASSISTED THORACOSCOPY (Left)  Subjective: Patient asking for a "light laxative" this am. He states "sometimes I have abdominal cramping". He denies nausea.  Objective: Vital signs in last 24 hours: Temp:  [97.7 F (36.5 C)-98.9 F (37.2 C)] 97.8 F (36.6 C) (08/13 0342) Pulse Rate:  [69-92] 72 (08/13 0342) Cardiac Rhythm: Normal sinus rhythm (08/13 0710) Resp:  [16-21] 19 (08/13 0342) BP: (105-119)/(58-70) 119/62 (08/13 0342) SpO2:  [97 %-98 %] 97 % (08/13 0342)     Intake/Output from previous day: 08/12 0701 - 08/13 0700 In: 240 [P.O.:240] Out: 850 [Urine:750; Chest Tube:100]   Physical Exam:  Cardiovascular: RRR Pulmonary: Coarse on the left and clear on the right Abdomen: Soft, non tender, bowel sounds present. Extremities:No lower extremity edema. Wounds: Clean and dry.  No erythema or signs of infection. Chest Tube: to water seal, air leak with cough  Lab Results: CBC: Recent Labs    05/07/21 0046  WBC 12.9*  HGB 9.1*  HCT 27.2*  PLT 246   BMET:  Recent Labs    05/07/21 0046  NA 134*  K 3.8  CL 103  CO2 24  GLUCOSE 103*  BUN 14  CREATININE 0.68  CALCIUM 8.4*    PT/INR: No results for input(s): LABPROT, INR in the last 72 hours. ABG:  INR: Will add last result for INR, ABG once components are confirmed Will add last 4 CBG results once components are confirmed  Assessment/Plan:  1. CV - SR 2.  Pulmonary - On room air. Left chest tube with 100 cc of output last 24 hours. Chest tube is to water seal, air leak with cough. CXR this am shows small left apical pneumothorax and left lateral chest wall subcutaneous emphysema.  Of note, chest tube was clamped yesterday and follow up CXR showed increased ptx so it was then unclamped. Will discuss management of chest tube with Dr. Kipp Brood.Encourage incentive  spirometer 3. Expected blood loss anemia-Last H and H stable at 9.1 and 27.2 4. LOC constipation; will restart Miralax in am and try Metamucil   Donielle M ZimmermanPA-C 05/09/2021,7:40 AM [phone]   Agree with above. Needs ambulation and pulmonary hygiene. Continue chest tube to [place]

## 2021-05-09 NOTE — Plan of Care (Signed)
  Problem: Clinical Measurements: Goal: Ability to maintain clinical measurements within normal limits will improve Outcome: Not Progressing Goal: Respiratory complications will improve Outcome: Not Progressing

## 2021-05-10 ENCOUNTER — Inpatient Hospital Stay (HOSPITAL_COMMUNITY): Payer: No Typology Code available for payment source

## 2021-05-10 MED ORDER — LACTULOSE 10 GM/15ML PO SOLN
20.0000 g | Freq: Once | ORAL | Status: AC
  Administered 2021-05-10: 20 g via ORAL
  Filled 2021-05-10: qty 30

## 2021-05-10 MED ORDER — POLYETHYLENE GLYCOL 3350 17 G PO PACK
17.0000 g | PACK | Freq: Every day | ORAL | Status: DC
  Administered 2021-05-11 – 2021-05-15 (×5): 17 g via ORAL
  Filled 2021-05-10 (×7): qty 1

## 2021-05-10 NOTE — Progress Notes (Signed)
Physical Therapy Treatment Patient Details Name: Kurt Bolton MRN: [id number] DOB: 03-Jun-1946 Today's Date: 05/10/2021    History of Present Illness pt is a 75 y/o male presenting 8/4 for surgical management of a L upper lobe pulmonary nodule concerning for primary malignancy.  8/4 s/p flexible video fiberoptic bronchocopy and biopsies, VATS for L upper lobectomy, and 8/5 for VATS explaration and drainage of hemothorax.  CT placed 8/5 to wall suction, then on water seal 8/12.  PMHx:  COPD, HLD,    PT Comments    Patient participated with physical therapy today, practicing bed mobility, transfers, and review of therapeutic exercises for progressive strengthening. Ambulated in room however limited due to notable orthostatic hypotension (see below.) Patient progressing towards rehab goals of improving strength and functional independence.    05/10/21 1120  Vital Signs     Orthostatic Lying   BP- Lying 107/62  Pulse- Lying 80  Orthostatic Sitting  BP- Sitting 102/67  Pulse- Sitting 87  Orthostatic Standing at 0 minutes  BP- Standing at 0 minutes (!) 77/60  Pulse- Standing at 0 minutes 105      Follow Up Recommendations    SNF;Supervision/Assistance - 24 hour     Equipment Recommendations  Rolling walker with 5" wheels    Recommendations for Other Services       Precautions / Restrictions Precautions Precautions: Fall Precaution Comments: orthostatic hypotension, chest tube left (to water seal 8/12) Restrictions Weight Bearing Restrictions: No    Mobility  Bed Mobility Overal bed mobility: Needs Assistance Bed Mobility: Rolling;Sidelying to Sit Rolling: Min guard Sidelying to sit: Min guard       General bed mobility comments: Cues for technique, assist with lines/leads only. Pt able to slowly pull himself up to seated position.    Transfers Overall transfer level: Needs assistance Equipment used: Rolling walker (2 wheeled) Transfers: Sit to/from Stand Sit  to Stand: Min assist;From elevated surface         General transfer comment: Min assist for boost from elevated surface. Cues for hand placement and technique. Orthostatics+ see below. No symptoms. Had pt sit, well controlled. Performed second time.  Ambulation/Gait Ambulation/Gait assistance: Min assist Gait Distance (Feet): 18 Feet Assistive device: Rolling walker (2 wheeled) Gait Pattern/deviations: Trunk flexed;Step-through pattern;Decreased stride length Gait velocity: slow   General Gait Details: Min assist for RW control, no overt LOB while holding RW. Cues for direction, turning the wrong way. Limited distance due to orthostatic hypotension although asymptomatic 77/60 in standing.   Stairs             Wheelchair Mobility    Modified Rankin (Stroke Patients Only)       Balance Overall balance assessment: Needs assistance Sitting-balance support: No upper extremity supported Sitting balance-Leahy Scale: Fair Sitting balance - Comments: EOB without support, kyphotic Postural control: Other (comment) (preference for anterior lean) Standing balance support: Bilateral upper extremity supported Standing balance-Leahy Scale: Poor Standing balance comment: RW for gait, crouched posture                            Cognition Arousal/Alertness: Awake/alert Behavior During Therapy: WFL for tasks assessed/performed Overall Cognitive Status: Within Functional Limits for tasks assessed                                        Exercises General Exercises - Lower Extremity  Ankle Circles/Pumps: AROM;Both;Seated;20 reps Quad Sets: Strengthening;Both;15 reps;Seated Gluteal Sets: Strengthening;Both;15 reps;Seated Long Arc Quad: Both;Seated;Strengthening;15 reps Hip ABduction/ADduction: Strengthening;Both;15 reps;Seated Hip Flexion/Marching: AROM;Both;10 reps;Seated    General Comments General comments (skin integrity, edema, etc.): See  orthostatics      Pertinent Vitals/Pain Pain Assessment: 0-10 Pain Score: 10-Worst pain ever Pain Location: Only with coughing Pain Descriptors / Indicators: Sharp Pain Intervention(s): Limited activity within patient's tolerance;Monitored during session;Premedicated before session;Repositioned    Home Living                      Prior Function            PT Goals (current goals can now be found in the care plan section) Acute Rehab PT Goals Patient Stated Goal: ultimately get home independent PT Goal Formulation: With patient Time For Goal Achievement: 05/17/21 Potential to Achieve Goals: Good Progress towards PT goals: Progressing toward goals    Frequency    Min 3X/week      PT Plan Current plan remains appropriate    Co-evaluation              AM-PAC PT "6 Clicks" Mobility   Outcome Measure  Help needed turning from your back to your side while in a flat bed without using bedrails?: A Little Help needed moving from lying on your back to sitting on the side of a flat bed without using bedrails?: A Little Help needed moving to and from a bed to a chair (including a wheelchair)?: A Little Help needed standing up from a chair using your arms (e.g., wheelchair or bedside chair)?: A Little Help needed to walk in hospital room?: A Lot Help needed climbing 3-5 steps with a railing? : A Lot 6 Click Score: 16    End of Session Equipment Utilized During Treatment: Gait belt Activity Tolerance: Treatment limited secondary to medical complications (Comment);Other (comment) (orthostatic hypotension) Patient left: in chair;with call bell/phone within reach;with chair alarm set Nurse Communication: Mobility status;Other (comment) (orthostatic hypotension, request compression hose.) PT Visit Diagnosis: Unsteadiness on feet (R26.81);Other abnormalities of gait and mobility (R26.89);Pain;Difficulty in walking, not elsewhere classified (R26.2);Muscle weakness  (generalized) (M62.81) Pain - part of body:  (L flank VATS site.)     Time: 8588-5027 PT Time Calculation (min) (ACUTE ONLY): 29 min  Charges:  $Gait Training: 8-22 mins $Therapeutic Exercise: 8-22 mins                     Ellouise Newer, PT, DPT    Ellouise Newer 05/10/2021, 11:33 AM

## 2021-05-10 NOTE — Progress Notes (Addendum)
      [address]       [place] [address]             [phone]       9 Days Post-Op Procedure(s) (LRB): VIDEO ASSISTED THORACOSCOPY (Left)  Subjective: He states "sometimes I have abdominal cramping". He denies nausea. He did not have bowel movement yesterday, despite laxative  Objective: Vital signs in last 24 hours: Temp:  [97.8 F (36.6 C)-98.6 F (37 C)] 98.6 F (37 C) (08/14 0736) Pulse Rate:  [75-94] 94 (08/14 0736) Cardiac Rhythm: Sinus tachycardia (08/13 1904) Resp:  [14-20] 18 (08/14 0736) BP: (96-128)/(55-75) 120/74 (08/14 0736) SpO2:  [95 %-98 %] 96 % (08/14 0736)     Intake/Output from previous day: 08/13 0701 - 08/14 0700 In: -  Out: 61 [Urine:700; Chest Tube:120]   Physical Exam:  Cardiovascular: RRR Pulmonary: Coarse on the left and clear on the right Abdomen: Soft, non tender, bowel sounds present. Extremities:No lower extremity edema. Wounds: Clean and dry.  No erythema or signs of infection. Chest Tube: to water seal, tidling but no air leak this am  Lab Results: CBC: No results for input(s): WBC, HGB, HCT, PLT in the last 72 hours.  BMET:  No results for input(s): NA, K, CL, CO2, GLUCOSE, BUN, CREATININE, CALCIUM in the last 72 hours.   PT/INR: No results for input(s): LABPROT, INR in the last 72 hours. ABG:  INR: Will add last result for INR, ABG once components are confirmed Will add last 4 CBG results once components are confirmed  Assessment/Plan:  1. CV - SR 2.  Pulmonary - On room air. Left chest tube with 120 cc of output last 24 hours. Chest tube is to water seal, tidling but no air leak with cough. CXR this am shows appears stable. Of note, chest tube was clamped Friday and follow up CXR showed increased ptx so it was then unclamped. Chest tube to remain for today. Check CXR in am. Encourage incentive spirometer 3. Expected blood loss anemia-Last H and H stable at 9.1 and 27.2 4. Deconditioned-must  ambulate, continue PT 5. LOC constipation  Sharalyn Ink [facility] 05/10/2021,8:22 AM [phone]    Chart reviewed, patient examined, agree with above. CXR this am shows small left ptx or space which is stable. There was tidaling in [place] and then after a heavy cough some air came through. After that, fluid column went to top and no tidaling. I suspect that he may still have a small air leak. Will keep to water seal today. May have to try clamping tube again at some point.

## 2021-05-11 ENCOUNTER — Inpatient Hospital Stay (HOSPITAL_COMMUNITY): Payer: No Typology Code available for payment source

## 2021-05-11 NOTE — Progress Notes (Addendum)
      [address]       [place] [address]             [phone]      10 Days Post-Op Procedure(s) (LRB): VIDEO ASSISTED THORACOSCOPY (Left) Subjective: Feels okay this morning, constipation is better.  Objective: Vital signs in last 24 hours: Temp:  [97.5 F (36.4 C)-98.6 F (37 C)] 98 F (36.7 C) (08/15 0358) Pulse Rate:  [68-94] 75 (08/15 0358) Cardiac Rhythm: Normal sinus rhythm (08/15 0700) Resp:  [15-20] 20 (08/15 0358) BP: (110-121)/(66-81) 116/73 (08/15 0358) SpO2:  [96 %-99 %] 98 % (08/15 0358)     Intake/Output from previous day: 08/14 0701 - 08/15 0700 In: 840 [P.O.:840] Out: 860 [Urine:750; Chest Tube:110] Intake/Output this shift: No intake/output data recorded.  General appearance: alert, cooperative, and no distress Heart: regular rate and rhythm, S1, S2 normal, no murmur, click, rub or gallop Lungs: clear to auscultation bilaterally Abdomen: soft, non-tender; bowel sounds normal; no masses,  no organomegaly Extremities: extremities normal, atraumatic, no cyanosis or edema Wound: clean and dry  Lab Results: No results for input(s): WBC, HGB, HCT, PLT in the last 72 hours. BMET: No results for input(s): NA, K, CL, CO2, GLUCOSE, BUN, CREATININE, CALCIUM in the last 72 hours.  PT/INR: No results for input(s): LABPROT, INR in the last 72 hours. ABG    Component Value Date/Time   PHART 7.258 (L) 05/01/2021 1006   HCO3 23.3 05/01/2021 1006   TCO2 25 05/01/2021 1006   ACIDBASEDEF 4.0 (H) 05/01/2021 1006   O2SAT 99.0 05/01/2021 1006   CBG (last 3)  No results for input(s): GLUCAP in the last 72 hours.  Assessment/Plan: S/P Procedure(s) (LRB): VIDEO ASSISTED THORACOSCOPY (Left)  1. CV - NSR in the 70s, BP well controlled 2.  Pulmonary - On room air. Left chest tube with 110 cc of output last 24 hours. Chest tube is to water seal, tidling but no air leak with cough. CXR this am shows appears stable. Of note, chest tube was clamped  Friday and follow up CXR showed increased ptx so it was then unclamped. Chest tube to remain for today. Check CXR in am. Encourage incentive spirometer 3. Expected blood loss anemia-Last H and H stable at 9.1 and 27.2 4. Deconditioned-must ambulate, continue PT 5. LOC constipation  Plan: Will plan to do another clamping trial this morning with a CXR at noon. If that looks okay we will remove the chest tube.    LOS: 11 days    Elgie Collard 05/11/2021  Agree with above Clamping trial today, if stable, will remove CT Dispo planning  Kemontae Dunklee O Talayeh Bruinsma

## 2021-05-11 NOTE — Progress Notes (Signed)
      [address]       [place] [address]             [phone]        CLINICAL DATA:  Follow-up pneumothorax.  Status post VATS   EXAM: PORTABLE CHEST 1 VIEW   COMPARISON:  05/11/2021   FINDINGS: Stable position of left chest tube. Previously noted barely perceptible left apical pneumothorax is difficult to visualize on the current exam. Calcified granuloma identified within the right lung. Extensive right apical pleuroparenchymal scarring is unchanged.   IMPRESSION: Stable position of left chest tube. Previously noted barely perceptible left apical pneumothorax is difficult to visualize on the current exam.     Electronically Signed   By: Kerby Moors M.D.   On: 05/11/2021 13:40    Plan: Okay to remove chest tube. CXR in the morning.

## 2021-05-11 NOTE — Progress Notes (Signed)
At 1600, patient's chest tube was removed. Tie suture secured with three knots, confirming good closure. No oozing or drainage occurred at site. Gauze applied. Patient tolerated well.

## 2021-05-11 NOTE — TOC Progression Note (Addendum)
Transition of Care [facility]) - Progression Note    Patient Details  Name: Kurt Bolton MRN: [id number] Date of Birth: 01/08/1946  [facility] CM/SW Contact  Reece Agar, [place] Phone Number: 05/11/2021, 10:17 AM  Clinical Narrative:    CSW emailed completed [place] paperwork to [place] benefits case worker. [place] contacted CSW via phone to explain that when pt chooses one of the facilities listed he is already approved for 32 days and the paperwork is just an agreement to pay a co-pay if pt stays over the 32 day. CSW will provide pt with this information.  CSW reached out to [place] at [facility] who is currently reviewing pt information. CSW will continue to follow.   Expected Discharge Plan: [place] Barriers to Discharge: Continued Medical Work up  Expected Discharge Plan and Services Expected Discharge Plan: [place] arrangements for the past 2 months: Mobile Home                                       Social Determinants of Health (SDOH) Interventions    Readmission Risk Interventions No flowsheet data found.

## 2021-05-11 NOTE — Progress Notes (Signed)
SATURATION QUALIFICATIONS: (This note is used to comply with regulatory documentation for home oxygen)  Patient Saturations on Room Air at Rest = 94%  Patient Saturations on Room Air while Ambulating = 92-94%  Patient Saturations on 0 Liters of oxygen while Ambulating = 92-94%  Please briefly explain why patient needs home oxygen:

## 2021-05-11 NOTE — Plan of Care (Signed)
  Problem: Education: Goal: Knowledge of General Education information will improve Description: Including pain rating scale, medication(s)/side effects and non-pharmacologic comfort measures Outcome: Progressing   Problem: Health Behavior/Discharge Planning: Goal: Ability to manage health-related needs will improve Outcome: Progressing   Problem: Clinical Measurements: Goal: Will remain free from infection Outcome: Progressing Goal: Diagnostic test results will improve Outcome: Progressing Goal: Respiratory complications will improve Outcome: Progressing   Problem: Nutrition: Goal: Adequate nutrition will be maintained Outcome: Progressing   Problem: Pain Managment: Goal: General experience of comfort will improve Outcome: Progressing   Problem: Skin Integrity: Goal: Risk for impaired skin integrity will decrease Outcome: Progressing

## 2021-05-11 NOTE — Progress Notes (Signed)
Mobility Specialist: Progress Note   05/11/21 1744  Orthostatic Lying   BP- Lying 111/65  Pulse- Lying 84  Orthostatic Sitting  BP- Sitting 105/67  Orthostatic Standing at 0 minutes  BP- Standing at 0 minutes 105/64  Mobility  Activity Ambulated in hall  Level of Assistance Minimal assist, patient does 75% or more  Assistive Device Front wheel walker  Distance Ambulated (ft) 80 ft  Mobility Ambulated with assistance in hallway  Mobility Response Tolerated fair  Mobility performed by Mobility specialist  $Mobility charge 1 Mobility   Pre-Mobility: 84 HR, 111/65 BP, 100% SpO2 Post-Mobility: 97 HR, 118/87 BP, 100% SpO2  Pt c/o feeling "breathless" after completing standing BP and required a seated break lasting 1 minute. Pt agreeable to ambulation after break. Distance limited d/t fatigue and feeling SOB during ambulation. Pt back to bed after walk per request with call bell and phone at his side.   [facility] Shelsey Rieth Mobility Specialist Mobility Specialist Phone: [phone]

## 2021-05-12 ENCOUNTER — Inpatient Hospital Stay (HOSPITAL_COMMUNITY): Payer: No Typology Code available for payment source

## 2021-05-12 LAB — SARS CORONAVIRUS 2 (TAT 6-24 HRS): SARS Coronavirus 2: NEGATIVE

## 2021-05-12 NOTE — Progress Notes (Signed)
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 8/4 s/p flexible video fiberoptic bronchocopy and biopsies, VATS for L upper lobectomy, and 8/5 for VATS explaration and drainage of hemothorax. CT placed 8/5 to wall suction, then on water seal 8/12. PMHx: COPD, HLD.    PT Comments    Pt received in supine, agreeable to therapy session with encouragement and with fair participation and tolerance for transfer training, supine and seated exercises and short gait trial at bedside. Pt with compression socks donned but loose fit at calves, will need smaller/tighter size for improved hemodynamics. VSS on RA, unable to get a proper standing BP reading but pt denies sx orthostatic hypotension this date. Pt continues to benefit from PT services to progress toward functional mobility goals.     Follow Up Recommendations  SNF;Supervision/Assistance - 24 hour     Equipment Recommendations  Rolling walker with 5" wheels    Recommendations for Other Services       Precautions / Restrictions Precautions Precautions: Fall Precaution Comments: orthostatic hypotension Restrictions Weight Bearing Restrictions: No    Mobility  Bed Mobility Overal bed mobility: Needs Assistance Bed Mobility: Rolling;Sidelying to Sit Rolling: Min guard Sidelying to sit: Min guard       General bed mobility comments: Cues for technique, heavy encouragement to initiate.    Transfers Overall transfer level: Needs assistance Equipment used: Rolling walker (2 wheeled) Transfers: Sit to/from Stand Sit to Stand: Min assist;From elevated surface         General transfer comment: Min assist for boost from EOB. Cues for hand placement and technique. No orthostatic symptoms. Had pt sit, well  controlled.  Ambulation/Gait Ambulation/Gait assistance: Min assist Gait Distance (Feet): 20 Feet Assistive device: Rolling walker (2 wheeled) Gait Pattern/deviations: Trunk flexed;Step-through pattern;Decreased stride length Gait velocity: slow Gait velocity interpretation: <1.8 ft/sec, indicate of risk for recurrent falls General Gait Details: Min assist for RW control, no overt LOB while holding RW but poor posture (pt very kyphotic and tends to crouch/not grip RW until cued for improved posture/hand palcement) and drifts slgihtly to L/R.   Stairs             Wheelchair Mobility    Modified Rankin (Stroke Patients Only)       Balance Overall balance assessment: Needs assistance Sitting-balance support: No upper extremity supported Sitting balance-Leahy Scale: Fair Sitting balance - Comments: EOB without support, kyphotic Postural control: Other (comment) (preference for anterior lean) Standing balance support: Bilateral upper extremity supported Standing balance-Leahy Scale: Poor Standing balance comment: RW for gait, crouched posture                            Cognition Arousal/Alertness: Awake/alert Behavior During Therapy: WFL for tasks assessed/performed Overall Cognitive Status: Within Functional Limits for tasks assessed Area of Impairment: Attention;Memory;Safety/judgement;Problem solving                   Current Attention Level: Focused Memory: Decreased recall of precautions;Decreased short-term memory   Safety/Judgement: Decreased awareness of safety;Decreased awareness of deficits   Problem Solving: Slow processing;Difficulty sequencing;Requires verbal cues General Comments: Pt frequently tangential and needs redirection to task.      Exercises General Exercises - Lower Extremity Ankle Circles/Pumps: AROM;Both;Seated;20 reps Long  Arc Quad: Both;Seated;Strengthening;15 reps Heel Slides: AROM;Both;10 reps;Supine Hip  Flexion/Marching: AROM;Both;10 reps;Seated Other Exercises Other Exercises: serial STS x 5 (increased time/cues) from chair Other Exercises: gait billed as TE for BLE strengthening    General Comments General comments (skin integrity, edema, etc.): BP 152/132 (140) standing at RW (likely inaccurate due to LUE support on RW during reading); BP 125/73 once seated in chair and pt denies dizziness during gait trial; SpO2 WNL on RA and HR WNL      Pertinent Vitals/Pain Pain Assessment: Faces Faces Pain Scale: Hurts little more Pain Location: with coughing and bed mobility Pain Descriptors / Indicators: Sharp Pain Intervention(s): Limited activity within patient's tolerance;Monitored during session;Repositioned     PT Goals (current goals can now be found in the care plan section) Acute Rehab PT Goals Patient Stated Goal: ultimately get home independent PT Goal Formulation: With patient Time For Goal Achievement: 05/17/21 Potential to Achieve Goals: Good Progress towards PT goals: Progressing toward goals    Frequency    Min 3X/week      PT Plan Current plan remains appropriate       AM-PAC PT "6 Clicks" Mobility   Outcome Measure  Help needed turning from your back to your side while in a flat bed without using bedrails?: A Little Help needed moving from lying on your back to sitting on the side of a flat bed without using bedrails?: A Little Help needed moving to and from a bed to a chair (including a wheelchair)?: A Little Help needed standing up from a chair using your arms (e.g., wheelchair or bedside chair)?: A Little Help needed to walk in hospital room?: A Little Help needed climbing 3-5 steps with a railing? : A Lot 6 Click Score: 17    End of Session Equipment Utilized During Treatment: Gait belt Activity Tolerance: Patient tolerated treatment well;Patient limited by fatigue Patient left: in chair;with call bell/phone within reach;with chair alarm set;with  nursing/sitter in room Nurse Communication: Mobility status;Other (comment) (compression socks a bit loose around calves, could probably benefit from thigh high/smaller size.) PT Visit Diagnosis: Unsteadiness on feet (R26.81);Other abnormalities of gait and mobility (R26.89);Pain;Difficulty in walking, not elsewhere classified (R26.2);Muscle weakness (generalized) (M62.81) Pain - part of body:  (L flank VATS site.)     Time: 1217-1237 PT Time Calculation (min) (ACUTE ONLY): 20 min  Charges:  $Therapeutic Exercise: 8-22 mins                     Letita Prentiss P., PTA Acute Rehabilitation Services Pager: [phone] Office: [place] 05/12/2021, 2:32 PM

## 2021-05-12 NOTE — Progress Notes (Addendum)
      [address]       [place] [address]             [phone]      11 Days Post-Op Procedure(s) (LRB): VIDEO ASSISTED THORACOSCOPY (Left) Subjective: Feels okay this morning, enjoying his bacon  Objective: Vital signs in last 24 hours: Temp:  [97.7 F (36.5 C)-98.2 F (36.8 C)] 97.7 F (36.5 C) (08/16 0330) Pulse Rate:  [68-88] 68 (08/16 0330) Cardiac Rhythm: Normal sinus rhythm (08/16 0700) Resp:  [18-20] 19 (08/16 0330) BP: (104-123)/(57-70) 110/57 (08/16 0330) SpO2:  [94 %-100 %] 98 % (08/16 0330)     Intake/Output from previous day: 08/15 0701 - 08/16 0700 In: 520 [P.O.:520] Out: 1495 [Urine:1495] Intake/Output this shift: No intake/output data recorded.  General appearance: alert, cooperative, and no distress Heart: regular rate and rhythm, S1, S2 normal, no murmur, click, rub or gallop Lungs: clear to auscultation bilaterally Abdomen: soft, non-tender; bowel sounds normal; no masses,  no organomegaly Extremities: extremities normal, atraumatic, no cyanosis or edema Wound: clean and dry, some ecchymosis along the left lower back  Lab Results: No results for input(s): WBC, HGB, HCT, PLT in the last 72 hours. BMET: No results for input(s): NA, K, CL, CO2, GLUCOSE, BUN, CREATININE, CALCIUM in the last 72 hours.  PT/INR: No results for input(s): LABPROT, INR in the last 72 hours. ABG    Component Value Date/Time   PHART 7.258 (L) 05/01/2021 1006   HCO3 23.3 05/01/2021 1006   TCO2 25 05/01/2021 1006   ACIDBASEDEF 4.0 (H) 05/01/2021 1006   O2SAT 99.0 05/01/2021 1006   CBG (last 3)  No results for input(s): GLUCAP in the last 72 hours.  Assessment/Plan: S/P Procedure(s) (LRB): VIDEO ASSISTED THORACOSCOPY (Left)  1. CV - NSR in the 70s, BP well controlled 2.  Pulmonary - On room air. Chest tube removed yesterday without issue. This morning no pneumothorax on f/u exam. Minimal subcutaneous emphysema.  Encourage incentive spirometer 3.  Expected blood loss anemia-Last H and H stable at 9.1 and 27.2 4. Deconditioned-must ambulate, continue PT 5. Creatinine 0.68, electrolytes okay  Plan: Discharge to SNF when bed available. I will order COVID test. CXR stable this morning.    LOS: 12 days    Elgie Collard 05/12/2021   Agree with above. Chest tube out, and chest x-ray stable. Dispo planning.  Keen Ewalt [name]

## 2021-05-12 NOTE — TOC Progression Note (Signed)
Transition of Care [facility]) - Progression Note    Patient Details  Name: Kurt Bolton MRN: [id number] Date of Birth: 10/02/1945  Transition of Care [facility]) CM/SW [place], [place] Phone Number: 05/12/2021, 2:15 PM  Clinical Narrative:    CSW spoke with Whitney at [facility] this morning to  follow up on pt DC today. Whitney then informed CSW that she no longer have the bed available. CSW then updated the pt and he states that he does not care which facility he goes to. Pt states he is having uncontrollable pain. CSW faxed out information to [place] facility [facility]) which was the next closest facility. CSW spoke with [facility] admissions coordinator who stated they do have an available bed and she will review pt information to confirm if they are able to care for pt needs. CSW will continue to follow for updates.   Expected Discharge Plan: [place] Barriers to Discharge: Continued Medical Work up  Expected Discharge Plan and Services Expected Discharge Plan: [place] arrangements for the past 2 months: Mobile Home                                       Social Determinants of Health (SDOH) Interventions    Readmission Risk Interventions No flowsheet data found.

## 2021-05-13 MED ORDER — ASPIRIN 325 MG PO TBEC: 325.0000 mg | DELAYED_RELEASE_TABLET | Freq: Every day | ORAL | 0 refills | Status: AC

## 2021-05-13 MED ORDER — TRAMADOL HCL 50 MG PO TABS: 50.0000 mg | ORAL_TABLET | Freq: Four times a day (QID) | ORAL | 0 refills | Status: DC | PRN

## 2021-05-13 NOTE — Plan of Care (Signed)
  Problem: Education: Goal: Knowledge of General Education information will improve Description: Including pain rating scale, medication(s)/side effects and non-pharmacologic comfort measures Outcome: Progressing   Problem: Health Behavior/Discharge Planning: Goal: Ability to manage health-related needs will improve Outcome: Progressing   Problem: Clinical Measurements: Goal: Ability to maintain clinical measurements within normal limits will improve Outcome: Progressing Goal: Will remain free from infection Outcome: Progressing Goal: Diagnostic test results will improve Outcome: Progressing Goal: Respiratory complications will improve Outcome: Progressing Goal: Cardiovascular complication will be avoided Outcome: Progressing   Problem: Activity: Goal: Risk for activity intolerance will decrease Outcome: Progressing   Problem: Nutrition: Goal: Adequate nutrition will be maintained Outcome: Progressing   Problem: Coping: Goal: Level of anxiety will decrease Outcome: Progressing   Problem: Elimination: Goal: Will not experience complications related to bowel motility Outcome: Progressing Goal: Will not experience complications related to urinary retention Outcome: Progressing   Problem: Pain Managment: Goal: General experience of comfort will improve Outcome: Progressing   Problem: Safety: Goal: Ability to remain free from injury will improve Outcome: Progressing   Problem: Education: Goal: Knowledge of disease or condition will improve Outcome: Progressing Goal: Knowledge of the prescribed therapeutic regimen will improve Outcome: Progressing   Problem: Activity: Goal: Risk for activity intolerance will decrease Outcome: Progressing   Problem: Cardiac: Goal: Will achieve and/or maintain hemodynamic stability Outcome: Progressing   Problem: Clinical Measurements: Goal: Postoperative complications will be avoided or minimized Outcome: Progressing    Problem: Pain Management: Goal: Pain level will decrease Outcome: Progressing   Problem: Skin Integrity: Goal: Wound healing without signs and symptoms infection will improve Outcome: Progressing

## 2021-05-13 NOTE — TOC Progression Note (Addendum)
Transition of Care [facility]) - Progression Note    Patient Details  Name: Kurt Bolton MRN: [id number] Date of Birth: July 03, 1946  Transition of Care [facility]) CM/SW Contact  Reece Agar, [place] Phone Number: 05/13/2021, 1:09 PM  Clinical Narrative:    Driscilla Grammes contacted CSW to share that they cannot make an offer for pt under his current condition. CSW reached back out to [place] for bed availability, Whitney at [facility] stated she will contact CSW if anything else becomes available. CSW will continue to follow up with [place] facilities.   CSW contacted Vikki Ports pt case worker who stated that as long as pt has transportation back to his home after he is completed his rehab pt will be to go to [place]. CSW will follow up with pt.   Expected Discharge Plan: [place] Barriers to Discharge: Continued Medical Work up  Expected Discharge Plan and Services Expected Discharge Plan: [place] arrangements for the past 2 months: Mobile Home                                       Social Determinants of Health (SDOH) Interventions    Readmission Risk Interventions No flowsheet data found.

## 2021-05-13 NOTE — Progress Notes (Addendum)
      [address]       [place] [address]             [phone]      12 Days Post-Op Procedure(s) (LRB): VIDEO ASSISTED THORACOSCOPY (Left)  Subjective: Patient states he is feeling pretty good this morning.  Got some pain medication overnight and its taken full effect.    Objective: Vital signs in last 24 hours: Temp:  [97.3 F (36.3 C)-98.2 F (36.8 C)] 97.3 F (36.3 C) (08/17 0318) Pulse Rate:  [68-80] 74 (08/17 0318) Cardiac Rhythm: Normal sinus rhythm;Bundle branch block (08/17 0742) Resp:  [16-20] 16 (08/17 0318) BP: (96-128)/(47-83) 96/47 (08/17 0318) SpO2:  [95 %-100 %] 95 % (08/17 0318)  Intake/Output from previous day: 08/16 0701 - 08/17 0700 In: 480 [P.O.:480] Out: 900 [Urine:900] Intake/Output this shift:  General appearance: alert, cooperative, and no distress Heart: regular rate and rhythm Lungs: clear to auscultation bilaterally Abdomen: soft, non-tender; bowel sounds normal; no masses,  no organomegaly Extremities: extremities normal, atraumatic, no cyanosis or edema Wound: clean and dry  Lab Results: No results for input(s): WBC, HGB, HCT, PLT in the last 72 hours. BMET: No results for input(s): NA, K, CL, CO2, GLUCOSE, BUN, CREATININE, CALCIUM in the last 72 hours.  PT/INR: No results for input(s): LABPROT, INR in the last 72 hours. ABG    Component Value Date/Time   PHART 7.258 (L) 05/01/2021 1006   HCO3 23.3 05/01/2021 1006   TCO2 25 05/01/2021 1006   ACIDBASEDEF 4.0 (H) 05/01/2021 1006   O2SAT 99.0 05/01/2021 1006   CBG (last 3)  No results for input(s): GLUCAP in the last 72 hours.  Assessment/Plan: S/P Procedure(s) (LRB): VIDEO ASSISTED THORACOSCOPY (Left)  CV- NSR, BP in the 90s- not on anti-hypertensive agents Pulm- off oxygen, continue IS Deconditioning- awaiting SNF placement, COVID test is negative Dispo- patient stable, for discharge once SNF bed can be arranged   LOS: 13 days    Ellwood Handler,  PA-C 05/13/2021   Agree with above Clear from a surgical standpoint [place]

## 2021-05-13 NOTE — Progress Notes (Signed)
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 8/4 s/p flexible video fiberoptic bronchocopy and biopsies, VATS for L upper lobectomy, and 8/5 for VATS explaration and drainage of hemothorax. CT placed 8/5 to wall suction, then on water seal 8/12. PMHx: COPD, HLD.    PT Comments    Pt received in chair, agreeable to therapy session and with good participation and tolerance for hallway gait training with RW. Pt without acute s/sx distress with mobility this date, BP stable with TED hose donned and able to progress gait distance to 161ft with RW and min guard to Supervision. Pt Supervision for transfers. Pt continues to demonstrate some slow processing but improving safety awareness and may progress to HHPT in 1-2 more sessions, currently pt would need some level of assist/supervision for safety (he reports no nearby friends/family so would need paid caregiver assist) if he decides to go home. DC recs updated for clarification per discussion with supervising PT Katie Z.  Follow Up Recommendations  SNF;Home health PT;Supervision for mobility/OOB (HHPT if able to arrange Supervision, otherwise SNF)     Equipment Recommendations  Rolling walker with 5" wheels    Recommendations for Other Services       Precautions / Restrictions Precautions Precautions: Fall Precaution Comments: orthostatic hypotension Restrictions Weight Bearing Restrictions: No    Mobility  Bed Mobility      General bed mobility comments: pt received/remained in chair    Transfers Overall transfer level: Needs assistance Equipment used: Rolling walker (2 wheeled) Transfers: Sit to/from Stand Sit to Stand: Supervision         General transfer comment: from chair, Supervision and  fair carryover of safety cues from previous session  Ambulation/Gait Ambulation/Gait assistance: Min guard Gait Distance (Feet): 100 Feet Assistive device: Rolling walker (2 wheeled) Gait Pattern/deviations: Trunk flexed;Step-through pattern;Decreased stride length Gait velocity: slow Gait velocity interpretation: <1.31 ft/sec, indicative of household ambulator General Gait Details: no LOB, improved steadiness and RW management, min directional/postural cues and for activity pacing; no dizziness or buckling observed      Balance Overall balance assessment: Needs assistance Sitting-balance support: No upper extremity supported Sitting balance-Leahy Scale: Fair Sitting balance - Comments: kyphotic but able to sit unsupported forward in chair Postural control: Other (comment) (preference for anterior lean) Standing balance support: Bilateral upper extremity supported Standing balance-Leahy Scale: Poor Standing balance comment: RW for gait, crouched posture                            Cognition Arousal/Alertness: Awake/alert Behavior During Therapy: WFL for tasks assessed/performed Overall Cognitive Status: Within Functional Limits for tasks assessed Area of Impairment: Attention;Memory;Safety/judgement;Problem solving                   Current Attention Level: Focused Memory: Decreased recall of precautions;Decreased short-term memory   Safety/Judgement: Decreased awareness of deficits   Problem Solving: Slow processing;Requires verbal cues General Comments: Pt frequently tangential and needs redirection to task but improving safety awareness with hand placement with mobility.      Exercises General Exercises - Lower Extremity Ankle Circles/Pumps: AROM;Both;Seated;20 reps Straight Leg Raises: AROM;Both;5 reps;Supine Hip Flexion/Marching: AROM;Both;10 reps;Seated Other Exercises Other Exercises: serial STS x 5 (increased time/cues) from chair Other  Exercises: gait billed  as TE for BLE strengthening    General Comments General comments (skin integrity, edema, etc.): SpO2 as low as 90% with exertion on RA, returned to 99% on RA at rest within 1-2 mins; HR 100-110 bpml BO 96/75 pre-mobility and 116/76 post-exertion      Pertinent Vitals/Pain Pain Assessment: Faces Faces Pain Scale: Hurts a little bit Pain Location: with coughing and occasionally with deep breaths Pain Descriptors / Indicators: Sharp Pain Intervention(s): Monitored during session;Repositioned    Home Living                      Prior Function            PT Goals (current goals can now be found in the care plan section) Acute Rehab PT Goals Patient Stated Goal: ultimately get home independent PT Goal Formulation: With patient Time For Goal Achievement: 05/17/21 Potential to Achieve Goals: Good Progress towards PT goals: Progressing toward goals    Frequency    Min 3X/week      PT Plan Discharge plan needs to be updated    Co-evaluation              AM-PAC PT "6 Clicks" Mobility   Outcome Measure  Help needed turning from your back to your side while in a flat bed without using bedrails?: A Little Help needed moving from lying on your back to sitting on the side of a flat bed without using bedrails?: A Little Help needed moving to and from a bed to a chair (including a wheelchair)?: A Little Help needed standing up from a chair using your arms (e.g., wheelchair or bedside chair)?: A Little Help needed to walk in hospital room?: A Little Help needed climbing 3-5 steps with a railing? : A Little 6 Click Score: 18    End of Session Equipment Utilized During Treatment: Gait belt Activity Tolerance: Patient tolerated treatment well Patient left: in chair;with call bell/phone within reach;with chair alarm set;with nursing/sitter in room Nurse Communication: Mobility status;Other (comment) (compression socks a bit loose around calves,  could probably benefit from thigh high/smaller size.) PT Visit Diagnosis: Unsteadiness on feet (R26.81);Other abnormalities of gait and mobility (R26.89);Pain;Difficulty in walking, not elsewhere classified (R26.2);Muscle weakness (generalized) (M62.81) Pain - part of body:  (L flank VATS site.)     Time: 5329-9242 PT Time Calculation (min) (ACUTE ONLY): 17 min  Charges:  $Gait Training: 8-22 mins                     Chanceler Pullin P., PTA Acute Rehabilitation Services Pager: [phone] Office: [place] 05/13/2021, 3:50 PM

## 2021-05-14 ENCOUNTER — Other Ambulatory Visit: Payer: Self-pay | Admitting: *Deleted

## 2021-05-14 NOTE — TOC Progression Note (Addendum)
Transition of Care [facility]) - Progression Note    Patient Details  Name: Kurt Bolton MRN: [id number] Date of Birth: Oct 06, 1945  Transition [facility] Care [facility]) CM/SW [place], [place] Phone Number: 05/14/2021, 9:36 AM  Clinical Narrative:    CSW contacted Gabriel Earing, pt [facility] benefits coordinator, no answer CSW left a message inquiring about pt homecare coverage since PT has now recommended HH as a DC option.  CSW attempted to contact [employer] again, no answer.   CSW contacted Sherald Barge with no answer, CSW left a message. CSW reached out to [place] supervisor who is out on leave until October. CSW followed up with resources left on [place] VM no answers, CSW left VM's and contacted [facility] no answer. CSW will follow up again with all facilities contracted with [facility].   Expected Discharge Plan: [place] Barriers to Discharge: Continued Medical Work up  Expected Discharge Plan and Services Expected Discharge Plan: [place] arrangements for the past 2 months: Mobile Home                                       Social Determinants of Health (SDOH) Interventions    Readmission Risk Interventions No flowsheet data found.

## 2021-05-14 NOTE — TOC Progression Note (Signed)
Transition of Care [facility]) - Progression Note    Patient Details  Name: Kurt Bolton MRN: [id number] Date of Birth: 1946-09-18  Transition of Care [facility]) CM/SW Contact  Zenon Mayo, RN Phone Number: 05/14/2021, 4:22 PM  Clinical Narrative:    NCM spoke with patient, he states he does not have any family to help him at all, no friends.  He states the family that was helping him moved to [place].  This NCM tried to contact Brianna at the [facility] and left a message for her to return Symone's call regarding this patient.  We will need physical therapy to work with this patient daily to get him stronger to where he could go home because looks like the [place] is not returning the [place] calls to assist with getting a SNF for him.  Will inform MD.   Expected Discharge Plan: [place] Barriers to Discharge: Continued Medical Work up  Expected Discharge Plan and Services Expected Discharge Plan: [place] arrangements for the past 2 months: Mobile Home                                       Social Determinants of Health (SDOH) Interventions    Readmission Risk Interventions No flowsheet data found.

## 2021-05-14 NOTE — Progress Notes (Addendum)
      [address]       [place] [address]             [phone]      13 Days Post-Op Procedure(s) (LRB): VIDEO ASSISTED THORACOSCOPY (Left) Subjective: Feels okay this morning, he had pain earlier but the pain medication helped  Objective: Vital signs in last 24 hours: Temp:  [97.6 F (36.4 C)-98.3 F (36.8 C)] 98.2 F (36.8 C) (08/18 0334) Pulse Rate:  [62-82] 65 (08/18 0334) Cardiac Rhythm: Normal sinus rhythm (08/18 0334) Resp:  [14-21] 21 (08/18 0334) BP: (111-121)/(60-68) 121/67 (08/18 0334) SpO2:  [94 %-97 %] 96 % (08/18 0334) Weight:  [58.1 kg-59.6 kg] 59.6 kg (08/18 0334)     Intake/Output from previous day: 08/17 0701 - 08/18 0700 In: 960 [P.O.:960] Out: 900 [Urine:900] Intake/Output this shift: No intake/output data recorded.  General appearance: alert, cooperative, and no distress Heart: regular rate and rhythm, S1, S2 normal, no murmur, click, rub or gallop Lungs: clear to auscultation bilaterally Abdomen: soft, non-tender; bowel sounds normal; no masses,  no organomegaly Extremities: extremities normal, atraumatic, no cyanosis or edema Wound: clean and dry  Lab Results: No results for input(s): WBC, HGB, HCT, PLT in the last 72 hours. BMET: No results for input(s): NA, K, CL, CO2, GLUCOSE, BUN, CREATININE, CALCIUM in the last 72 hours.  PT/INR: No results for input(s): LABPROT, INR in the last 72 hours. ABG    Component Value Date/Time   PHART 7.258 (L) 05/01/2021 1006   HCO3 23.3 05/01/2021 1006   TCO2 25 05/01/2021 1006   ACIDBASEDEF 4.0 (H) 05/01/2021 1006   O2SAT 99.0 05/01/2021 1006   CBG (last 3)  No results for input(s): GLUCAP in the last 72 hours.  Assessment/Plan: S/P Procedure(s) (LRB): VIDEO ASSISTED THORACOSCOPY (Left)  CV- NSR in the 80s, BP better this morning.  Pulm- off oxygen, continue IS, no acute issues Deconditioning- awaiting SNF placement, COVID test is negative Dispo- patient stable for discharge  once SNF bed can be arranged   LOS: 14 days    Elgie Collard 05/14/2021   Agree with above. Continue PT Dispo planning.  Awaiting SNF bed  Mikle Sternberg O Raywood Wailes

## 2021-05-14 NOTE — Progress Notes (Signed)
The proposed treatment discussed in cancer conference is for discussion purpose only and is not a binding recommendation. The patient was not physically examined nor present for their treatment options. Therefore, final treatment plans cannot be decided.  ?

## 2021-05-15 ENCOUNTER — Ambulatory Visit: Payer: Self-pay | Admitting: Thoracic Surgery (Cardiothoracic Vascular Surgery)

## 2021-05-15 NOTE — Progress Notes (Addendum)
      [address]       [place] [address]             [phone]      14 Days Post-Op Procedure(s) (LRB): VIDEO ASSISTED THORACOSCOPY (Left) Subjective: Patient states he tried to walk several times yesterday but couldn't get any help  Objective: Vital signs in last 24 hours: Temp:  [97.6 F (36.4 C)-98.1 F (36.7 C)] 97.6 F (36.4 C) (08/19 0300) Pulse Rate:  [71-95] 71 (08/19 0300) Cardiac Rhythm: Normal sinus rhythm (08/18 1910) Resp:  [17-22] 21 (08/19 0300) BP: (100-116)/(61-102) 111/61 (08/19 0300) SpO2:  [96 %-99 %] 96 % (08/19 0300)     Intake/Output from previous day: 08/18 0701 - 08/19 0700 In: 1080 [P.O.:1080] Out: 925 [Urine:925] Intake/Output this shift: No intake/output data recorded.  General appearance: alert, cooperative, and no distress Heart: regular rate and rhythm, S1, S2 normal, no murmur, click, rub or gallop Lungs: clear to auscultation bilaterally Abdomen: soft, non-tender; bowel sounds normal; no masses,  no organomegaly Extremities: extremities normal, atraumatic, no cyanosis or edema Wound: clean and dry  Lab Results: No results for input(s): WBC, HGB, HCT, PLT in the last 72 hours. BMET: No results for input(s): NA, K, CL, CO2, GLUCOSE, BUN, CREATININE, CALCIUM in the last 72 hours.  PT/INR: No results for input(s): LABPROT, INR in the last 72 hours. ABG    Component Value Date/Time   PHART 7.258 (L) 05/01/2021 1006   HCO3 23.3 05/01/2021 1006   TCO2 25 05/01/2021 1006   ACIDBASEDEF 4.0 (H) 05/01/2021 1006   O2SAT 99.0 05/01/2021 1006   CBG (last 3)  No results for input(s): GLUCAP in the last 72 hours.  Assessment/Plan: S/P Procedure(s) (LRB): VIDEO ASSISTED THORACOSCOPY (Left)  CV- NSR in the 80s, BP better this morning.  Pulm- off oxygen, continue IS, no acute issues Deconditioning- awaiting SNF placement, COVID test is negative Dispo- patient stable for discharge once SNF bed can be arranged. Patient  lives alone and has had a prolonged hospital stay so absolutely needs SNF.    LOS: 15 days    Elgie Collard 05/15/2021  Agree with above. Dispel planning  Montey Ebel [name]

## 2021-05-15 NOTE — Progress Notes (Signed)
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 8/4 s/p flexible video fiberoptic bronchocopy and biopsies, VATS for L upper lobectomy, and 8/5 for VATS explaration and drainage of hemothorax. CT placed 8/5 to wall suction, then on water seal 8/12. PMHx: COPD, HLD.    PT Comments    Pt received in chair, agreeable to therapy session and with good participation and tolerance for hallway gait and transfer training with RW. Pt with improving attention to task but remains quick to fatigue and with poor posture (kyphotic). He reports 6/10 modified RPE (fatigue) at end of session. Pt continues to benefit from PT services to progress toward functional mobility goals. Continue to recommend SNF as pt does not have any support at home and continues to demonstrate safety and endurance deficits from baseline.   Follow Up Recommendations  SNF;Home health PT;Supervision for mobility/OOB (HHPT if able to arrange Supervision, otherwise SNF)     Equipment Recommendations  Rolling walker with 5" wheels    Recommendations for Other Services       Precautions / Restrictions Precautions Precautions: Fall Precaution Comments: orthostatic hypotension Restrictions Weight Bearing Restrictions: No    Mobility  Bed Mobility Overal bed mobility: Needs Assistance Bed Mobility: Supine to Sit     Supine to sit: Min guard;HOB elevated     General bed mobility comments: pt received in chair and wanted to remain up in chair at end of session    Transfers Overall transfer level: Needs assistance Equipment used: Rolling walker (2 wheeled) Transfers: Sit to/from Stand Sit to Stand: Min guard         General transfer comment: from chair<>RW, cues for hand placement with fair  carryover  Ambulation/Gait Ambulation/Gait assistance: Min guard Gait Distance (Feet): 120 Feet Assistive device: Rolling walker (2 wheeled) Gait Pattern/deviations: Trunk flexed;Step-through pattern;Decreased stride length     General Gait Details: including 1 standing break after ~27ft to replace finger sensor; increased WOB but SpO2 WNL when waveform signal is good, no dizziness reported (compression socks remained donned during mobility).   Stairs             Wheelchair Mobility    Modified Rankin (Stroke Patients Only)       Balance Overall balance assessment: Needs assistance;Independent Sitting-balance support: No upper extremity supported;Feet supported Sitting balance-Leahy Scale: Good Sitting balance - Comments: kyphotic but able to sit unsupported forward in chair   Standing balance support: Bilateral upper extremity supported Standing balance-Leahy Scale: Poor Standing balance comment: RW for gait, crouched posture                            Cognition Arousal/Alertness: Awake/alert Behavior During Therapy: WFL for tasks assessed/performed Overall Cognitive Status: Within Functional Limits for tasks assessed Area of Impairment: Attention;Memory;Safety/judgement;Problem solving                   Current Attention Level: Sustained Memory: Decreased recall of precautions;Decreased short-term memory       Problem Solving: Slow processing;Requires verbal cues General Comments: Following cues fairly well today, internally distracted due to pain waxing/waning.      Exercises General Exercises - Lower Extremity Ankle Circles/Pumps: AROM;Both;Seated;20 reps Long Arc Quad: Both;Seated;Strengthening;15 reps Hip Flexion/Marching: AROM;Both;10 reps;Seated Other Exercises Other  Exercises: Pt pulling [phone] on IS x10 reps    General Comments General comments (skin integrity, edema, etc.): HR 88 bpm resting and to 115 bpm with exertion.  BP 103/71 pre-mobility and BP 123/69 post-exertional      Pertinent Vitals/Pain Pain Assessment: Faces Faces Pain Scale: Hurts little more Pain Location: Left lateral flank with coughing. Occasionally with deep breaths, moving. Pain Descriptors / Indicators: Sore;Shooting;Stabbing (Intermittent) Pain Intervention(s): Monitored during session;Limited activity within patient's tolerance;Premedicated before session;Repositioned (per pt he had morphine earlier)    Home Living Family/patient expects to be discharged to:: Skilled nursing facility Living Arrangements: Alone   Type of Home: Mobile home Home Access: Stairs to enter Entrance Stairs-Rails: Right;Left Home Layout: One level Home Equipment: Bedside commode;Cane - single point;Shower seat      Prior Function Level of Independence: Independent;Independent with assistive device(s)      Comments: occasional use of the cane, driving, doing all of his IADLs on his own   PT Goals (current goals can now be found in the care plan section) Acute Rehab PT Goals Patient Stated Goal: To go home but I can't manage by myself right now PT Goal Formulation: With patient Time For Goal Achievement: 05/17/21 Progress towards PT goals: Progressing toward goals    Frequency    Min 3X/week      PT Plan Current plan remains appropriate       AM-PAC PT "6 Clicks" Mobility   Outcome Measure  Help needed turning from your back to your side while in a flat bed without using bedrails?: A Little Help needed moving from lying on your back to sitting on the side of a flat bed without using bedrails?: A Little Help needed moving to and from a bed to a chair (including a wheelchair)?: A Little Help needed standing up from a chair using your arms (e.g., wheelchair or bedside chair)?: A Little Help needed to walk in hospital room?: A Little Help needed climbing 3-5 steps with a railing? : A Little 6 Click Score: 18    End of Session Equipment  Utilized During Treatment: Gait belt Activity Tolerance: Patient tolerated treatment well Patient left: in chair;with call bell/phone within reach;with chair alarm set Nurse Communication: Mobility status PT Visit Diagnosis: Unsteadiness on feet (R26.81);Other abnormalities of gait and mobility (R26.89);Pain;Difficulty in walking, not elsewhere classified (R26.2);Muscle weakness (generalized) (M62.81)     Time: 4315-4008 PT Time Calculation (min) (ACUTE ONLY): 24 min  Charges:  $Gait Training: 8-22 mins $Therapeutic Exercise: 8-22 mins                     Thomos Domine P., PTA Acute Rehabilitation Services Pager: [phone] Office: [place] 05/15/2021, 3:59 PM

## 2021-05-15 NOTE — Evaluation (Signed)
Occupational Therapy Evaluation Patient Details Name: Kurt Bolton MRN: [id number] DOB: 1946-02-28 Today's Date: 05/15/2021    History of Present Illness pt is a 75 y/o male presenting 8/4 for surgical management of a L upper lobe pulmonary nodule concerning for primary malignancy. 8/4 s/p flexible video fiberoptic bronchocopy and biopsies, VATS for L upper lobectomy, and 8/5 for VATS explaration and drainage of hemothorax. CT placed 8/5 to wall suction, then on water seal 8/12. PMHx: COPD, HLD.   Clinical Impression   This 75 yo male admitted with above presents to acute OT with PLOF of completely independent with basic ADLs, IADLs, and driving as well as use of SPC intermittently.Currently he is setup/S-min A for basic ADLs with limitations of orthostatic HTN further complicating his independence. He will continue to benefit from acute OT with follow up at SNF to get to a Mod I to independent level and return home alone.    Follow Up Recommendations  SNF;Supervision/Assistance - 24 hour    Equipment Recommendations  Other (comment) (TBD next venue)       Precautions / Restrictions Precautions Precautions: Fall Precaution Comments: orthostatic hypotension Restrictions Weight Bearing Restrictions: No      Mobility Bed Mobility Overal bed mobility: Needs Assistance Bed Mobility: Supine to Sit     Supine to sit: Min guard;HOB elevated     General bed mobility comments: VCs for hand placement/sequencing to make it easier on him    Transfers Overall transfer level: Needs assistance Equipment used: Rolling walker (2 wheeled) Transfers: Sit to/from Stand Sit to Stand: Min assist              Balance Overall balance assessment: Needs assistance;Independent Sitting-balance support: No upper extremity supported;Feet supported Sitting balance-Leahy Scale: Good Sitting balance - Comments: kyphotic but able to sit unsupported forward in chair   Standing balance support:  Bilateral upper extremity supported Standing balance-Leahy Scale: Poor Standing balance comment: RW for gait, crouched posture                           ADL either performed or assessed with clinical judgement   ADL Overall ADL's : Needs assistance/impaired Eating/Feeding: Independent;Sitting   Grooming: Set up;Sitting   Upper Body Bathing: Set up;Sitting   Lower Body Bathing: Minimal assistance;Sit to/from stand   Upper Body Dressing : Set up;Sitting   Lower Body Dressing: Minimal assistance;Sit to/from stand   Toilet Transfer: Minimal assistance;Ambulation;RW Toilet Transfer Details (indicate cue type and reason): bed>around to recliner Toileting- Clothing Manipulation and Hygiene: Minimal assistance;Sit to/from stand               Vision Baseline Vision/History: Wears glasses Wears Glasses: At all times Patient Visual Report: No change from baseline              Pertinent Vitals/Pain Pain Assessment: Faces Faces Pain Scale: Hurts little more Pain Location: left lateral flank with coughing. occasionally with deep breaths, moving Pain Descriptors / Indicators: Sore;Shooting;Stabbing Pain Intervention(s): Limited activity within patient's tolerance;Monitored during session     Hand Dominance Right   Extremity/Trunk Assessment Upper Extremity Assessment Upper Extremity Assessment: Generalized weakness           Communication Communication Communication: No difficulties   Cognition Arousal/Alertness: Awake/alert Behavior During Therapy: WFL for tasks assessed/performed Overall Cognitive Status: Within Functional Limits for tasks assessed  General Comments  Pt did not have his ted hose on when I arrived--said they were bothering him (rubbing raw place on upper leg (inspected but did not see any place; too tight; catching on his little toes toe nails--one came off the other I taped down).No Ted  hose on sitting 122/66 HR 88, RR24. No ted hose standing 89/60 HR 97 RR 23 (reported weakness and quizziness). No ted hose sitting down post 1 minute 103/71 HR 92 RR 17. Applied ted hose and encouraged pt to keep them on during the day--can have off at night.            Home Living Family/patient expects to be discharged to:: Skilled nursing facility Living Arrangements: Alone   Type of Home: Mobile home Home Access: Stairs to enter Entrance Stairs-Number of Steps: 4 Entrance Stairs-Rails: Right;Left Home Layout: One level     Bathroom Shower/Tub: Tub/shower unit         Home Equipment: Bedside commode;Cane - single point;Shower seat          Prior Functioning/Environment Level of Independence: Independent;Independent with assistive device(s)        Comments: occasional use of the cane, driving, doing all of his IADLs on his own        OT Problem List: Cardiopulmonary status limiting activity;Impaired balance (sitting and/or standing)      OT Treatment/Interventions: Self-care/ADL training;DME and/or AE instruction;Patient/family education    OT Goals(Current goals can be found in the care plan section) Acute Rehab OT Goals Patient Stated Goal: to go home but I can't manage by myself right now OT Goal Formulation: With patient Time For Goal Achievement: 05/29/21 Potential to Achieve Goals: Good  OT Frequency: Min 2X/week   Barriers to D/C: Decreased caregiver support             AM-PAC OT "6 Clicks" Daily Activity     Outcome Measure Help from another person eating meals?: None Help from another person taking care of personal grooming?: A Little Help from another person toileting, which includes using toliet, bedpan, or urinal?: A Little Help from another person bathing (including washing, rinsing, drying)?: A Little Help from another person to put on and taking off regular upper body clothing?: A Little Help from another person to put on and taking off  regular lower body clothing?: A Little 6 Click Score: 19   End of Session Equipment Utilized During Treatment: Gait belt;Rolling walker  Activity Tolerance:  (limited by drop in BP) Patient left: in chair;with call bell/phone within reach;with chair alarm set  OT Visit Diagnosis: Unsteadiness on feet (R26.81);Other abnormalities of gait and mobility (R26.89);Muscle weakness (generalized) (M62.81)                Time: 9826-4158 OT Time Calculation (min): 28 min Charges:  OT General Charges $OT Visit: 1 Visit OT Evaluation $OT Eval Moderate Complexity: 1 Mod OT Treatments $Self Care/Home Management : 8-22 mins  Kurt Bolton, Kurt Bolton Acute [employer] Pager [phone] Office [phone]    Kurt Bolton 05/15/2021, 2:03 PM

## 2021-05-15 NOTE — Plan of Care (Signed)
  Problem: Education: Goal: Knowledge of General Education information will improve Description: Including pain rating scale, medication(s)/side effects and non-pharmacologic comfort measures Outcome: Progressing   Problem: Health Behavior/Discharge Planning: Goal: Ability to manage health-related needs will improve Outcome: Progressing   Problem: Clinical Measurements: Goal: Ability to maintain clinical measurements within normal limits will improve Outcome: Progressing Goal: Will remain free from infection Outcome: Progressing Goal: Diagnostic test results will improve Outcome: Progressing Goal: Respiratory complications will improve Outcome: Progressing Goal: Cardiovascular complication will be avoided Outcome: Progressing   Problem: Activity: Goal: Risk for activity intolerance will decrease Outcome: Progressing   Problem: Nutrition: Goal: Adequate nutrition will be maintained Outcome: Progressing   Problem: Coping: Goal: Level of anxiety will decrease Outcome: Progressing   Problem: Elimination: Goal: Will not experience complications related to bowel motility Outcome: Progressing Goal: Will not experience complications related to urinary retention Outcome: Progressing   Problem: Pain Managment: Goal: General experience of comfort will improve Outcome: Progressing   Problem: Safety: Goal: Ability to remain free from injury will improve Outcome: Progressing   Problem: Skin Integrity: Goal: Risk for impaired skin integrity will decrease Outcome: Progressing   Problem: Education: Goal: Knowledge of disease or condition will improve Outcome: Progressing Goal: Knowledge of the prescribed therapeutic regimen will improve Outcome: Progressing   Problem: Cardiac: Goal: Will achieve and/or maintain hemodynamic stability Outcome: Progressing   Problem: Clinical Measurements: Goal: Postoperative complications will be avoided or minimized Outcome: Progressing    Problem: Pain Management: Goal: Pain level will decrease Outcome: Progressing

## 2021-05-15 NOTE — TOC Progression Note (Addendum)
Transition of Care [facility]) - Progression Note    Patient Details  Name: Christ Fullenwider MRN: [id number] Date of Birth: 1946-02-09  Transition of Care [facility]) CM/SW [place] Phone Number: 05/15/2021, 8:57 AM  Clinical Narrative:    Gabriel Earing, pt [facility] contacted hospital CSW to follow up about [facility] services for pt if there is no SNF that will take him. [facility] shared that it would be fine to DC pt with [facility] but they do not fund any 24 hour care for the pt. Pt has no family or help at home and will need assistance at home other than the limited [facility] services.  [facility] SNF may be able to accept pt if he does not have any out patient appointments that they would have to transfer the pt to. CSW will follow up with RN to confirm DC appointments.   Pt is not 70% connected and can only go to facilities listed in previous note. [facility] is not a option.   CSW will continue to follow up with SNF for DC on Monday.    Expected Discharge Plan: [place] Barriers to Discharge: Continued Medical Work up  Expected Discharge Plan and Services Expected Discharge Plan: [place] arrangements for the past 2 months: Mobile Home                                       Social Determinants of Health (SDOH) Interventions    Readmission Risk Interventions No flowsheet data found.

## 2021-05-16 MED ORDER — MORPHINE SULFATE (PF) 2 MG/ML IV SOLN
2.0000 mg | Freq: Four times a day (QID) | INTRAVENOUS | Status: DC | PRN
  Administered 2021-05-17: 2 mg via INTRAVENOUS
  Filled 2021-05-16: qty 1

## 2021-05-16 NOTE — Plan of Care (Signed)
  Problem: Education: Goal: Knowledge of General Education information will improve Description: Including pain rating scale, medication(s)/side effects and non-pharmacologic comfort measures Outcome: Progressing   Problem: Health Behavior/Discharge Planning: Goal: Ability to manage health-related needs will improve Outcome: Progressing   Problem: Clinical Measurements: Goal: Ability to maintain clinical measurements within normal limits will improve Outcome: Progressing Goal: Will remain free from infection Outcome: Progressing Goal: Diagnostic test results will improve Outcome: Progressing Goal: Respiratory complications will improve Outcome: Progressing Goal: Cardiovascular complication will be avoided Outcome: Progressing   Problem: Activity: Goal: Risk for activity intolerance will decrease Outcome: Progressing   Problem: Nutrition: Goal: Adequate nutrition will be maintained Outcome: Progressing   Problem: Coping: Goal: Level of anxiety will decrease Outcome: Progressing   Problem: Elimination: Goal: Will not experience complications related to bowel motility Outcome: Progressing Goal: Will not experience complications related to urinary retention Outcome: Progressing   Problem: Pain Managment: Goal: General experience of comfort will improve Outcome: Progressing   Problem: Safety: Goal: Ability to remain free from injury will improve Outcome: Progressing   Problem: Skin Integrity: Goal: Risk for impaired skin integrity will decrease Outcome: Progressing   Problem: Education: Goal: Knowledge of disease or condition will improve Outcome: Progressing Goal: Knowledge of the prescribed therapeutic regimen will improve Outcome: Progressing   Problem: Activity: Goal: Risk for activity intolerance will decrease Outcome: Progressing   Problem: Cardiac: Goal: Will achieve and/or maintain hemodynamic stability Outcome: Progressing   Problem: Clinical  Measurements: Goal: Postoperative complications will be avoided or minimized Outcome: Progressing   Problem: Pain Management: Goal: Pain level will decrease Outcome: Progressing   Problem: Skin Integrity: Goal: Wound healing without signs and symptoms infection will improve Outcome: Progressing

## 2021-05-16 NOTE — Progress Notes (Addendum)
      [address]       [place] [address]             [phone]      15 Days Post-Op Procedure(s) (LRB): VIDEO ASSISTED THORACOSCOPY (Left) Subjective: Feels okay this morning, no complaints  Objective: Vital signs in last 24 hours: Temp:  [97.7 F (36.5 C)-98.5 F (36.9 C)] 97.7 F (36.5 C) (08/20 0716) Pulse Rate:  [69-75] 69 (08/20 0716) Cardiac Rhythm: Heart block (08/20 0700) Resp:  [15-21] 15 (08/20 0716) BP: (109-117)/(58-65) 110/62 (08/20 0716) SpO2:  [94 %-100 %] 95 % (08/20 0716)     Intake/Output from previous day: 08/19 0701 - 08/20 0700 In: 240 [P.O.:240] Out: 525 [Urine:525] Intake/Output this shift: Total I/O In: 480 [P.O.:480] Out: -   General appearance: alert, cooperative, and no distress Heart: regular rate and rhythm, S1, S2 normal, no murmur, click, rub or gallop Lungs: clear to auscultation bilaterally Abdomen: soft, non-tender; bowel sounds normal; no masses,  no organomegaly Extremities: extremities normal, atraumatic, no cyanosis or edema Wound: clean and dry  Lab Results: No results for input(s): WBC, HGB, HCT, PLT in the last 72 hours. BMET: No results for input(s): NA, K, CL, CO2, GLUCOSE, BUN, CREATININE, CALCIUM in the last 72 hours.  PT/INR: No results for input(s): LABPROT, INR in the last 72 hours. ABG    Component Value Date/Time   PHART 7.258 (L) 05/01/2021 1006   HCO3 23.3 05/01/2021 1006   TCO2 25 05/01/2021 1006   ACIDBASEDEF 4.0 (H) 05/01/2021 1006   O2SAT 99.0 05/01/2021 1006   CBG (last 3)  No results for input(s): GLUCAP in the last 72 hours.  Assessment/Plan: S/P Procedure(s) (LRB): VIDEO ASSISTED THORACOSCOPY (Left)  CV- NSR in the 70s, BP better this morning.  Pulm- off oxygen, continue IS, no acute issues Deconditioning- awaiting SNF placement, COVID test is negative Constipation-metamucil ordered  Pain control, still requiring morphine about twice a day, will change to q6 PRN. He  knows he will not have this medication at discharge. Dispo- patient stable for discharge once SNF bed can be arranged. Patient lives alone and has had a prolonged hospital stay so absolutely needs SNF.    LOS: 16 days    Kurt Bolton 05/16/2021 Patient seen and examined, agree with above  Remo Lipps C. Roxan Hockey, MD [facility] [phone]

## 2021-05-17 MED ORDER — NICOTINE 14 MG/24HR TD PT24
14.0000 mg | MEDICATED_PATCH | Freq: Every day | TRANSDERMAL | Status: DC
  Administered 2021-05-17 – 2021-05-20 (×4): 14 mg via TRANSDERMAL
  Filled 2021-05-17 (×4): qty 1

## 2021-05-17 MED ORDER — OXYCODONE HCL 5 MG PO TABS
5.0000 mg | ORAL_TABLET | Freq: Four times a day (QID) | ORAL | Status: DC | PRN
  Administered 2021-05-17 – 2021-05-20 (×6): 5 mg via ORAL
  Filled 2021-05-17 (×7): qty 1

## 2021-05-17 NOTE — TOC Progression Note (Signed)
Transition of Care [facility]) - Progression Note    Patient Details  Name: Kurt Bolton MRN: [id number] Date of Birth: Aug 13, 1946  Transition of Care [facility]) CM/SW Contact  Joanne Chars, LCSW Phone Number: 05/17/2021, 2:10 PM  Clinical Narrative:  CSW attempted to call [facility], and [facility] in [place].  None of them had admissions people working on the weekends and all asked that [place] call back Monday.       Expected Discharge Plan: [place] Barriers to Discharge: Continued Medical Work up  Expected Discharge Plan and Services Expected Discharge Plan: [place] arrangements for the past 2 months: Mobile Home                                       Social Determinants of Health (SDOH) Interventions    Readmission Risk Interventions No flowsheet data found.

## 2021-05-17 NOTE — Progress Notes (Signed)
16 Days Post-Op Procedure(s) (LRB): VIDEO ASSISTED THORACOSCOPY (Left) Subjective: C/o pain unrelieved with tramadol, only morphine helps  Objective: Vital signs in last 24 hours: Temp:  [97.5 F (36.4 C)-98.2 F (36.8 C)] 97.6 F (36.4 C) (08/21 0718) Pulse Rate:  [68-79] 77 (08/21 0718) Cardiac Rhythm: Normal sinus rhythm (08/21 0723) Resp:  [19-21] 20 (08/21 0718) BP: (106-119)/(59-84) 108/67 (08/21 0718) SpO2:  [96 %-99 %] 97 % (08/21 0718)  Hemodynamic parameters for last 24 hours:    Intake/Output from previous day: 08/20 0701 - 08/21 0700 In: 480 [P.O.:480] Out: 825 [Urine:825] Intake/Output this shift: Total I/O In: 360 [P.O.:360] Out: 200 [Urine:200]  General appearance: alert, cooperative, and no distress Neurologic: intact Heart: regular rate and rhythm Lungs: diminished breath sounds left base Wound: clean and dry  Lab Results: No results for input(s): WBC, HGB, HCT, PLT in the last 72 hours. BMET: No results for input(s): NA, K, CL, CO2, GLUCOSE, BUN, CREATININE, CALCIUM in the last 72 hours.  PT/INR: No results for input(s): LABPROT, INR in the last 72 hours. ABG    Component Value Date/Time   PHART 7.258 (L) 05/01/2021 1006   HCO3 23.3 05/01/2021 1006   TCO2 25 05/01/2021 1006   ACIDBASEDEF 4.0 (H) 05/01/2021 1006   O2SAT 99.0 05/01/2021 1006   CBG (last 3)  No results for input(s): GLUCAP in the last 72 hours.  Assessment/Plan: S/P Procedure(s) (LRB): VIDEO ASSISTED THORACOSCOPY (Left) Still awaiting SNF bed Has been taking tramadol with no relief of pain, only IV morphine has been effective. Requesting oxycodone as it has been effective in past- will order Ambulate   LOS: 17 days    Melrose Nakayama 05/17/2021

## 2021-05-18 MED ORDER — OXYCODONE HCL 5 MG PO TABS: 5.0000 mg | ORAL_TABLET | Freq: Four times a day (QID) | ORAL | 0 refills | Status: DC | PRN

## 2021-05-18 NOTE — Progress Notes (Addendum)
      [address]       [place] [address]             [phone]      17 Days Post-Op Procedure(s) (LRB): VIDEO ASSISTED THORACOSCOPY (Left)  Subjective:  No new complaints.  Oxycodone as helped with pain relief.  Objective: Vital signs in last 24 hours: Temp:  [97.8 F (36.6 C)-98.4 F (36.9 C)] 98.1 F (36.7 C) (08/22 0308) Pulse Rate:  [70-88] 80 (08/22 0308) Cardiac Rhythm: Normal sinus rhythm (08/22 0700) Resp:  [18-21] 21 (08/22 0308) BP: (110-127)/(66-78) 125/66 (08/22 0308) SpO2:  [96 %-100 %] 96 % (08/22 0308)  Intake/Output from previous day: 08/21 0701 - 08/22 0700 In: 1320 [P.O.:1320] Out: 750 [Urine:750]   General appearance: alert, cooperative, and no distress Heart: regular rate and rhythm Lungs: clear to auscultation bilaterally Abdomen: soft, non-tender; bowel sounds normal; no masses,  no organomegaly Wound: clean and dry  Lab Results: No results for input(s): WBC, HGB, HCT, PLT in the last 72 hours. BMET: No results for input(s): NA, K, CL, CO2, GLUCOSE, BUN, CREATININE, CALCIUM in the last 72 hours.  PT/INR: No results for input(s): LABPROT, INR in the last 72 hours. ABG    Component Value Date/Time   PHART 7.258 (L) 05/01/2021 1006   HCO3 23.3 05/01/2021 1006   TCO2 25 05/01/2021 1006   ACIDBASEDEF 4.0 (H) 05/01/2021 1006   O2SAT 99.0 05/01/2021 1006   CBG (last 3)  No results for input(s): GLUCAP in the last 72 hours.  Assessment/Plan: S/P Procedure(s) (LRB): VIDEO ASSISTED THORACOSCOPY (Left)  CV- hemodynamically stable Pain control- off Morphine, tolerating oxycodone with good relief Deconditioned- lives alone, has absolutely no help... we are waiting on a SNF bed Dispo-patient remains stable for discharge to SNF once bed is available, off Morphine and is now controlled with Oxycodone   LOS: 18 days   Ell[place] Handler, PA-C 05/18/2021   Agree with above Dispo planning Awaiting SNF placement given lack of  support at home  [place]

## 2021-05-18 NOTE — Progress Notes (Signed)
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 8/4 s/p flexible video fiberoptic bronchocopy and biopsies, VATS for L upper lobectomy, and 8/5 for VATS explaration and drainage of hemothorax. CT placed 8/5 to wall suction, then on water seal 8/12. PMHx: COPD, HLD.    PT Comments    Pt received in supine, agreeable to therapy session and with good participation and tolerance for hallway gait trial. Pt able to progress gait distance to 151ft with RW and min guard at most, HR elevated to 123 bpm with exertion. Pt deferred compression socks this date and BP soft but fairly stable with transfers and pt asymptomatic, see below. Pt continues to benefit from PT services to progress toward functional mobility goals. Disposition below pending progress, continue to recommend SNF.  Follow Up Recommendations  Supervision for mobility/OOB;Other (comment);SNF (Pending progress; CIR would be helpful if he could qualify at modI level, although he would not have family supervision upon discharge, will continue to assess; HHPT if SNF unavailable.)     Equipment Recommendations  Rolling walker with 5" wheels    Recommendations for Other Services       Precautions / Restrictions Precautions Precautions: Fall Precaution Comments: compression socks for orthostatics, fairly stable BP 8/22 without Restrictions Weight Bearing Restrictions: No    Mobility  Bed Mobility Overal bed mobility: Needs Assistance Bed Mobility: Supine to Sit Rolling: Modified independent (Device/Increase time) Sidelying to sit: Supervision   Sit to supine: Min guard   General bed mobility comments: BLE assist to return to bed and cues for log rolling to reduce pain    Transfers Overall transfer  level: Needs assistance Equipment used: Rolling walker (2 wheeled) Transfers: Sit to/from Stand Sit to Stand: Min guard         General transfer comment: from EOB<>RW, cues for hand placement with fair carryover  Ambulation/Gait Ambulation/Gait assistance: Min guard Gait Distance (Feet): 140 Feet Assistive device: Rolling walker (2 wheeled) Gait Pattern/deviations: Trunk flexed;Step-through pattern;Decreased stride length     General Gait Details: cues for posture as pt tends to very kyphotic posture with forward head/rounded shoulders; HR max 123 bpm and SpO2 WNL on RA   Stairs             Wheelchair Mobility    Modified Rankin (Stroke Patients Only)       Balance Overall balance assessment: Needs assistance;Independent Sitting-balance support: No upper extremity supported;Feet supported Sitting balance-Leahy Scale: Good Sitting balance - Comments: kyphotic but able to sit unsupported at EOB   Standing balance support: Bilateral upper extremity supported Standing balance-Leahy Scale: Poor Standing balance comment: RW for gait, crouched posture                            Cognition Arousal/Alertness: Awake/alert Behavior During Therapy: WFL for tasks assessed/performed Overall Cognitive Status: Within Functional Limits for tasks assessed Area of Impairment: Attention;Memory;Safety/judgement;Problem solving                   Current Attention Level: Sustained Memory: Decreased recall of precautions;Decreased short-term memory       Problem Solving: Slow processing;Requires verbal cues General Comments: Poor carryover of postural instruction, needs min cues throughout for safety and activity pacing  Exercises General Exercises - Lower Extremity Ankle Circles/Pumps: AROM;Both;Seated;20 reps Long Arc Quad: Both;Seated;Strengthening;15 reps Hip Flexion/Marching: AROM;Both;10 reps;Seated Other Exercises Other Exercises: Pt pulling  [phone] on IS x3 reps    General Comments General comments (skin integrity, edema, etc.): BP 97/66 supine; BP 110/67 seated; BP 114/52 standing; BP 102/72 post-exertion seated EOB (all without compression socks donned)      Pertinent Vitals/Pain Pain Assessment: Faces Faces Pain Scale: Hurts even more Pain Location: Left lateral flank with coughing. Occasionally with deep breaths, moving c/o diaphragmatic pain. Pain Descriptors / Indicators: Sore;Shooting;Stabbing (Intermittent) Pain Intervention(s): Limited activity within patient's tolerance;Monitored during session;Repositioned    Home Living                      Prior Function            PT Goals (current goals can now be found in the care plan section) Acute Rehab PT Goals Patient Stated Goal: To go home but I can't manage by myself right now PT Goal Formulation: With patient Time For Goal Achievement: 05/17/21 Progress towards PT goals: Progressing toward goals    Frequency    Min 3X/week      PT Plan Current plan remains appropriate    Co-evaluation              AM-PAC PT "6 Clicks" Mobility   Outcome Measure  Help needed turning from your back to your side while in a flat bed without using bedrails?: None Help needed moving from lying on your back to sitting on the side of a flat bed without using bedrails?: A Little Help needed moving to and from a bed to a chair (including a wheelchair)?: A Little Help needed standing up from a chair using your arms (e.g., wheelchair or bedside chair)?: A Little Help needed to walk in hospital room?: A Little Help needed climbing 3-5 steps with a railing? : A Little 6 Click Score: 19    End of Session Equipment Utilized During Treatment: Gait belt Activity Tolerance: Patient tolerated treatment well Patient left: in bed;with call bell/phone within reach;with bed alarm set Nurse Communication: Mobility status PT Visit Diagnosis: Unsteadiness on feet  (R26.81);Other abnormalities of gait and mobility (R26.89);Pain;Difficulty in walking, not elsewhere classified (R26.2);Muscle weakness (generalized) (M62.81)     Time: 0321-2248 PT Time Calculation (min) (ACUTE ONLY): 17 min  Charges:  $Gait Training: 8-22 mins                     Serrina Minogue P., PTA Acute Rehabilitation Services Pager: [phone] Office: [place] 05/18/2021, 6:06 PM

## 2021-05-19 LAB — SARS CORONAVIRUS 2 (TAT 6-24 HRS): SARS Coronavirus 2: NEGATIVE

## 2021-05-19 NOTE — Progress Notes (Signed)
Inpatient Rehab Admissions Coordinator:   Pt. Offered a bed at [facility]. I do not have insurance auth for CIR and am not confident that I will get approval. Recommend that Pt. Accept [facility]'s bed offer. I will not pursue CIR admit at this time.    Clemens Catholic, [place], [place] Admissions Coordinator  [phone] ([place]) [phone] (office)

## 2021-05-19 NOTE — Progress Notes (Cosign Needed)
Inpatient Rehab Admissions Coordinator:   I am following for potential CIR admit; however, need updated OT note to determine if Pt is a candidate.   Clemens Catholic, [place], [place] Admissions Coordinator  [phone] ([place]) [phone] (office)

## 2021-05-19 NOTE — Progress Notes (Signed)
      [address]       [place] [address]             [phone]      18 Days Post-Op Procedure(s) (LRB): VIDEO ASSISTED THORACOSCOPY (Left)  Subjective: No new complaints.    Objective: Vital signs in last 24 hours: Temp:  [97.7 F (36.5 C)-97.8 F (36.6 C)] 97.8 F (36.6 C) (08/23 0723) Pulse Rate:  [68-80] 68 (08/22 2357) Cardiac Rhythm: Normal sinus rhythm (08/23 0700) Resp:  [15-24] 17 (08/23 0723) BP: (111-133)/(62-70) 125/63 (08/23 0723) SpO2:  [96 %-98 %] 96 % (08/22 2357)  Intake/Output from previous day: 08/22 0701 - 08/23 0700 In: 300 [P.O.:300] Out: 800 [Urine:800]  General appearance: alert, cooperative, and no distress Heart: regular rate and rhythm Lungs: clear to auscultation bilaterally Abdomen: soft, non-tender; bowel sounds normal; no masses,  no organomegaly Extremities: extremities normal, atraumatic, no cyanosis or edema Wound: clean and dry  Lab Results: No results for input(s): WBC, HGB, HCT, PLT in the last 72 hours. BMET: No results for input(s): NA, K, CL, CO2, GLUCOSE, BUN, CREATININE, CALCIUM in the last 72 hours.  PT/INR: No results for input(s): LABPROT, INR in the last 72 hours. ABG    Component Value Date/Time   PHART 7.258 (L) 05/01/2021 1006   HCO3 23.3 05/01/2021 1006   TCO2 25 05/01/2021 1006   ACIDBASEDEF 4.0 (H) 05/01/2021 1006   O2SAT 99.0 05/01/2021 1006   CBG (last 3)  No results for input(s): GLUCAP in the last 72 hours.  Assessment/Plan: S/P Procedure(s) (LRB): VIDEO ASSISTED THORACOSCOPY (Left)  CV- hemodynamically stable Pain control- stable on oxycodone Dispo- patient stable, needs placement, have been unable to arrange placement, have placed CIR consult per most recent PT notes, continue current care   LOS: 19 days    [place] Handler, PA-C 05/19/2021

## 2021-05-19 NOTE — Progress Notes (Signed)
OT Cancellation Note  Patient Details Name: Kurt Bolton MRN: [id number] DOB: [birth year]   Cancelled Treatment:    Reason Eval/Treat Not Completed: Pain limiting ability to participate. Patient met at conclusion of PT treatment session. Patient reporting pain limiting participation with OT at this time. Will check back after administration of pain medication.   Gloris Manchester OTR/L Supplemental OT, Department of rehab services [phone]  Jaquelinne Glendening R H. 05/19/2021, 10:38 AM

## 2021-05-19 NOTE — TOC Progression Note (Signed)
Transition of Care [facility]) - Progression Note    Patient Details  Name: Kurt Bolton MRN: [id number] Date of Birth: 1946-01-06  Transition of [facility]) CM/SW Contact  Reece Agar, [place] Phone Number: 05/19/2021, 2:42 PM  Clinical Narrative:    CSW reached out to Whitney at [facility] to follow up on any bed availability, Whitney confirmed that she can take pt in the morning. [facility] and [facility]/OT notes and follow up with DC Summary after completed.   Expected Discharge Plan: [place] Barriers to Discharge: Continued Medical Work up  Expected Discharge Plan and Services Expected Discharge Plan: [place] arrangements for the past 2 months: Mobile Home                                       Social Determinants of Health (SDOH) Interventions    Readmission Risk Interventions No flowsheet data found.

## 2021-05-19 NOTE — Progress Notes (Signed)
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 8/4 s/p flexible video fiberoptic bronchocopy and biopsies, VATS for L upper lobectomy, and 8/5 for VATS explaration and drainage of hemothorax. CT placed 8/5 to wall suction, then on water seal 8/12. PMHx: COPD, HLD.    PT Comments    Pt with increased pain today, RN provided pain medicine at end of session. Pain limiting transfer and ambulation tolerance this date as pt with onset of SOB due to pain with breathing and increased pain with mobility. Pt upset that his glasses were lost last night and that he doesn't have his debit card (he left at home per recommendation of MD) to pay his rent and utilities and they are now past due. Discussed with RN who is to contact CSW. Pt motivated to return home alone however is unsafe to do so at this time. Pt to benefit from aggressive rehab program to achieve safe mod I level, as pt currently unable to get his groceries or put on his compression socks without maxA. Pt does demonstrate good rehab potential to achieve safe mod I level of function. Acute PT to cont to follow.    Follow Up Recommendations  CIR     Equipment Recommendations  Rolling walker with 5" wheels    Recommendations for Other Services Rehab consult     Precautions / Restrictions Precautions Precautions: Fall Precaution Comments: compression socks donned as BP went from 115/78, to 100/76 upon sitting Restrictions Weight Bearing Restrictions: No    Mobility  Bed Mobility Overal bed mobility: Needs Assistance Bed Mobility: Rolling;Sidelying to Sit Rolling: Min guard Sidelying to sit: Min assist       General bed mobility comments: pt very slow to move, max directional verbal cues to stay on task,  minA for trunk elevation today due to increased pain    Transfers Overall transfer level: Needs assistance Equipment used: Rolling walker (2 wheeled) Transfers: Sit to/from Stand Sit to Stand: Min guard         General transfer comment: verbal cues to push up from bed not pull up on RW, increased time, groaning due to pain  Ambulation/Gait Ambulation/Gait assistance: Min guard Gait Distance (Feet): 100 Feet Assistive device: Rolling walker (2 wheeled) Gait Pattern/deviations: Trunk flexed;Step-through pattern;Decreased stride length Gait velocity: slow Gait velocity interpretation: <1.31 ft/sec, indicative of household ambulator General Gait Details: pt with frequent stops due to onset of pain, pt with SOB today due to onset of pain with deeper breaths, pt unable to hold head up in standing or sitting due to pain and weakness   Stairs             Wheelchair Mobility    Modified Rankin (Stroke Patients Only)       Balance Overall balance assessment: Needs assistance;Independent Sitting-balance support: Bilateral upper extremity supported Sitting balance-Leahy Scale: Fair Sitting balance - Comments: very kyphotic and unable to hold head up today, leaning on elbows on knees   Standing balance support: Bilateral upper extremity supported Standing balance-Leahy Scale: Poor Standing balance comment: RW for gait, crouched posture                            Cognition Arousal/Alertness: Awake/alert Behavior During Therapy: WFL for tasks  assessed/performed Overall Cognitive Status: Within Functional Limits for tasks assessed                                 General Comments: pt very tangential today as pt "lost his glasses" last night "now I can't see" states "I wasn't suppose to be here this long so I left my wallet at home because they said to leave valuables at home but now my rent and electric bill is past due and I can't pay it". attempted to  problem solve on how to get debit card however pt states "I have no one, I'm going to try to call my bank." When asked to call his landlord pt stated "It wont do any good, some big corporation in [place], they don't care" Pt with very depressed spirits today.      Exercises Other Exercises Other Exercises: completed some cervical neck stretching into extension and rotation    General Comments General comments (skin integrity, edema, etc.): BP 115/78 with HOB at 45 deg, upon sitting EOB s/p 3 min BP 100/76, compression socks donned to help manage BP during ambulation.      Pertinent Vitals/Pain Pain Assessment: 0-10 Faces Pain Scale: Hurts whole lot Pain Location: L lateral flank Pain Descriptors / Indicators: Sore (unable to take a regula breath) Pain Intervention(s): Monitored during session    Home Living                      Prior Function            PT Goals (current goals can now be found in the care plan section) Progress towards PT goals: Progressing toward goals    Frequency    Min 3X/week      PT Plan Discharge plan needs to be updated    Co-evaluation              AM-PAC PT "6 Clicks" Mobility   Outcome Measure  Help needed turning from your back to your side while in a flat bed without using bedrails?: None Help needed moving from lying on your back to sitting on the side of a flat bed without using bedrails?: A Little Help needed moving to and from a bed to a chair (including a wheelchair)?: A Little Help needed standing up from a chair using your arms (e.g., wheelchair or bedside chair)?: A Little Help needed to walk in hospital room?: A Little Help needed climbing 3-5 steps with a railing? : A Lot 6 Click Score: 18    End of Session Equipment Utilized During Treatment: Gait belt Activity Tolerance: Patient tolerated treatment well Patient left: in chair;with call bell/phone within reach;with chair alarm set;with nursing/sitter in  room Nurse Communication: Mobility status (depressed mood about not being able to pay bills or see because his glasses are lost) PT Visit Diagnosis: Unsteadiness on feet (R26.81);Other abnormalities of gait and mobility (R26.89);Pain;Difficulty in walking, not elsewhere classified (R26.2);Muscle weakness (generalized) (M62.81) Pain - Right/Left: Left Pain - part of body:  (flank, surgical site)     Time: 1000-1039 PT Time Calculation (min) (ACUTE ONLY): 39 min  Charges:  $Gait Training: 23-37 mins $Therapeutic Exercise: 8-22 mins                     Kittie Plater, PT, DPT Acute Rehabilitation Services Pager #: [phone] Office #: [phone]    [place] Lopes 05/19/2021,  11:06 AM

## 2021-05-20 ENCOUNTER — Institutional Professional Consult (permissible substitution): Payer: No Typology Code available for payment source | Admitting: Pulmonary Disease

## 2021-05-20 MED ORDER — NICOTINE 14 MG/24HR TD PT24: 14.0000 mg | MEDICATED_PATCH | Freq: Every day | TRANSDERMAL | 0 refills | Status: AC

## 2021-05-20 NOTE — Progress Notes (Signed)
Inpatient Rehab Admissions Coordinator:    Pt. Discharging to SNF. CIR to sign off.    Clemens Catholic, [place], [place] Admissions Coordinator  [phone] ([place]) [phone] (office)

## 2021-05-20 NOTE — Progress Notes (Signed)
      [address]       [place] [address]             [phone]      19 Days Post-Op Procedure(s) (LRB): VIDEO ASSISTED THORACOSCOPY (Left)  Subjective:  Sitting up in bed eating breakfast.  He has no complaints.    Objective: Vital signs in last 24 hours: Temp:  [97.7 F (36.5 C)-98.5 F (36.9 C)] 98.3 F (36.8 C) (08/24 0400) Pulse Rate:  [72-80] 72 (08/24 0400) Cardiac Rhythm: Normal sinus rhythm (08/24 0702) Resp:  [17-20] 19 (08/24 0400) BP: (115-128)/(61-77) 122/74 (08/24 0400) SpO2:  [94 %-99 %] 98 % (08/24 0400)  Intake/Output from previous day: 08/23 0701 - 08/24 0700 In: 240 [P.O.:240] Out: 550 [Urine:550]  General appearance: alert, cooperative, and no distress Heart: regular rate and rhythm Lungs: clear to auscultation bilaterally Abdomen: soft, non-tender; bowel sounds normal; no masses,  no organomegaly Extremities: extremities normal, atraumatic, no cyanosis or edema Wound: well healed  Lab Results: No results for input(s): WBC, HGB, HCT, PLT in the last 72 hours. BMET: No results for input(s): NA, K, CL, CO2, GLUCOSE, BUN, CREATININE, CALCIUM in the last 72 hours.  PT/INR: No results for input(s): LABPROT, INR in the last 72 hours. ABG    Component Value Date/Time   PHART 7.258 (L) 05/01/2021 1006   HCO3 23.3 05/01/2021 1006   TCO2 25 05/01/2021 1006   ACIDBASEDEF 4.0 (H) 05/01/2021 1006   O2SAT 99.0 05/01/2021 1006   CBG (last 3)  No results for input(s): GLUCAP in the last 72 hours.  Assessment/Plan: S/P Procedure(s) (LRB): VIDEO ASSISTED THORACOSCOPY (Left)  CV- hemodynamically stable Pulm- no acute issues, has been stable off oxygen, COVID-19 testing is negative Dispo- patient remains clinically stable, SNF placement has been arranged, will d/c home today   LOS: 20 days    Ellwood Handler, PA-C 05/20/2021

## 2021-05-20 NOTE — Progress Notes (Signed)
Physical Therapy Treatment Patient Details Name: Kurt Bolton MRN: [id number] DOB: [birth year] Today's Date: 05/20/2021    History of Present Illness pt is a 75 y/o male presenting 8/4 for surgical management of a L upper lobe pulmonary nodule concerning for primary malignancy. 8/4 s/p flexible video fiberoptic bronchocopy and biopsies, VATS for L upper lobectomy, and 8/5 for VATS explaration and drainage of hemothorax. CT placed 8/5 to wall suction, then on water seal 8/12. PMHx: COPD, HLD.    PT Comments    Pt received in supine, c/o pain but agreeable to therapy session with encouragement and with good participation and tolerance for gait progression using cane today. Pt with decreased stability using cane, needing up to minA for balance correction and difficulty maintaining a straight path, tending to drift to L/R in hallway. Pt HR <115 bpm and SpO2 >92% with exertion this date. Emphasis on importance of activity pacing, upright posture, leg exercises, OOB to chair frequently throughout day for improved pulmonary clearance and strengthening, and gradual progression of mobility within tolerance. Pt continues to benefit from PT services to progress toward functional mobility goals.   Follow Up Recommendations  CIR (VS SNF if CIR unavailable)     Equipment Recommendations  Rolling walker with 5" wheels    Recommendations for Other Services Rehab consult     Precautions / Restrictions Precautions Precautions: Fall Precaution Comments: compression socks if BP drops Restrictions Weight Bearing Restrictions: No    Mobility  Bed Mobility Overal bed mobility: Needs Assistance Bed Mobility: Rolling;Sidelying to Sit Rolling: Min guard Sidelying to sit: Min assist       General bed mobility comments: pt very slow to move, max directional verbal cues to stay on task, minA for trunk elevation today due to increased pain    Transfers Overall transfer level: Needs assistance Equipment  used: Rolling walker (2 wheeled) Transfers: Sit to/from Stand Sit to Stand: Min guard         General transfer comment: verbal cues to push up from bed not pull up on RW, increased time, groaning due to pain  Ambulation/Gait Ambulation/Gait assistance: Herbalist (Feet): 75 Feet (x2 with standing break) Assistive device: Straight cane Gait Pattern/deviations: Trunk flexed;Step-through pattern;Decreased stride length Gait velocity: slow   General Gait Details: mod cues for upright posture/head position, activity pacing, pt drifting to L/R and needs navigational cues, less stable with cane than with RW in previous sessions, gait speed slow <0.4 m/s indicating increased fall risk    Balance Overall balance assessment: Needs assistance;Independent Sitting-balance support: Bilateral upper extremity supported Sitting balance-Leahy Scale: Fair Sitting balance - Comments: very kyphotic and needs postural cues   Standing balance support: Bilateral upper extremity supported Standing balance-Leahy Scale: Poor Standing balance comment: RW for gait, crouched posture              Cognition Arousal/Alertness: Awake/alert Behavior During Therapy: WFL for tasks assessed/performed Overall Cognitive Status: Within Functional Limits for tasks assessed Area of Impairment: Safety/judgement;Memory     General Comments: pt more focused this date, noted to have his glasses in room and belongings packed for discharge. pt cooperative with tasks with encouragement.      Exercises General Exercises - Lower Extremity Ankle Circles/Pumps: AROM;Both;Seated;10 reps Long Arc Quad: Both;Seated;10 reps;AROM Hip Flexion/Marching: AROM;Both;10 reps;Seated    General Comments General comments (skin integrity, edema, etc.): BP 119/69 (82) pre mobility; BP 116/84 (92) post-exertion; no dizziness reported; SpO2 94-99% on RA with exertion, HR max to 113 bpm  and 89 bpm resting      Pertinent  Vitals/Pain Pain Assessment: Faces Faces Pain Scale: Hurts little more Pain Location: L lateral flank Pain Descriptors / Indicators: Sore (pain with deep breaths) Pain Intervention(s): Monitored during session;Premedicated before session;Repositioned     PT Goals (current goals can now be found in the care plan section) Acute Rehab PT Goals Patient Stated Goal: To go home but I can't manage by myself right now PT Goal Formulation: With patient Time For Goal Achievement: 05/17/21 Potential to Achieve Goals: Good Progress towards PT goals: Progressing toward goals    Frequency    Min 3X/week      PT Plan Current plan remains appropriate       AM-PAC PT "6 Clicks" Mobility   Outcome Measure  Help needed turning from your back to your side while in a flat bed without using bedrails?: None Help needed moving from lying on your back to sitting on the side of a flat bed without using bedrails?: A Little Help needed moving to and from a bed to a chair (including a wheelchair)?: A Little Help needed standing up from a chair using your arms (e.g., wheelchair or bedside chair)?: A Little Help needed to walk in hospital room?: A Little Help needed climbing 3-5 steps with a railing? : A Lot 6 Click Score: 18    End of Session Equipment Utilized During Treatment: Gait belt Activity Tolerance: Patient tolerated treatment well Patient left: in chair;with call bell/phone within reach;with chair alarm set Nurse Communication: Mobility status PT Visit Diagnosis: Unsteadiness on feet (R26.81);Other abnormalities of gait and mobility (R26.89);Pain;Difficulty in walking, not elsewhere classified (R26.2);Muscle weakness (generalized) (M62.81) Pain - Right/Left: Left Pain - part of body:  (flank, surgical site)     Time: 5883-2549 PT Time Calculation (min) (ACUTE ONLY): 13 min  Charges:  $Gait Training: 8-22 mins                     Jayde Mcallister P., PTA Acute Rehabilitation Services Pager:  [phone] Office: [place] 05/20/2021, 12:20 PM

## 2021-05-20 NOTE — TOC Transition Note (Signed)
Transition of Care [facility]) - CM/SW Discharge Note   Patient Details  Name: Kurt Bolton MRN: [id number] Date of Birth: 1946/04/27  [facility] CM/SW Contact:  Tresa Endo Phone Number: 05/20/2021, 9:20 AM   Clinical Narrative:    Patient will DC to: [facility] Anticipated DC date: 05/20/2021 Family notified: N/A Transport by: Corey Harold   Per MD patient ready for DC to [facility] room 111. RN to call report prior to discharge [phone]). RN, patient, patient's family, and facility notified of DC. Discharge Summary and FL2 sent to facility. DC packet on chart. Ambulance transport requested for patient.   CSW will sign off for now as social work intervention is no longer needed. Please consult [place] again if new needs arise.       Barriers to Discharge: Continued Medical Work up   Patient Goals and CMS Choice Patient states their goals for this hospitalization and ongoing recovery are:: SNF Rehab   Choice offered to / list presented to : Patient  Discharge Placement                       Discharge Plan and Services                                     Social Determinants of Health (SDOH) Interventions     Readmission Risk Interventions No flowsheet data found.

## 2021-05-20 NOTE — Plan of Care (Signed)
  Problem: Education: Goal: Knowledge of General Education information will improve Description: Including pain rating scale, medication(s)/side effects and non-pharmacologic comfort measures Outcome: Completed/Met   Problem: Health Behavior/Discharge Planning: Goal: Ability to manage health-related needs will improve Outcome: Completed/Met   Problem: Clinical Measurements: Goal: Ability to maintain clinical measurements within normal limits will improve Outcome: Completed/Met Goal: Will remain free from infection Outcome: Completed/Met Goal: Diagnostic test results will improve Outcome: Completed/Met Goal: Respiratory complications will improve Outcome: Completed/Met Goal: Cardiovascular complication will be avoided Outcome: Completed/Met   Problem: Activity: Goal: Risk for activity intolerance will decrease Outcome: Completed/Met   Problem: Nutrition: Goal: Adequate nutrition will be maintained Outcome: Completed/Met   Problem: Coping: Goal: Level of anxiety will decrease Outcome: Completed/Met   Problem: Elimination: Goal: Will not experience complications related to bowel motility Outcome: Completed/Met Goal: Will not experience complications related to urinary retention Outcome: Completed/Met   Problem: Pain Managment: Goal: General experience of comfort will improve Outcome: Completed/Met   Problem: Safety: Goal: Ability to remain free from injury will improve Outcome: Completed/Met   Problem: Skin Integrity: Goal: Risk for impaired skin integrity will decrease Outcome: Completed/Met   Problem: Education: Goal: Knowledge of disease or condition will improve Outcome: Completed/Met Goal: Knowledge of the prescribed therapeutic regimen will improve Outcome: Completed/Met   Problem: Activity: Goal: Risk for activity intolerance will decrease Outcome: Completed/Met   Problem: Cardiac: Goal: Will achieve and/or maintain hemodynamic stability Outcome:  Completed/Met   Problem: Clinical Measurements: Goal: Postoperative complications will be avoided or minimized Outcome: Completed/Met   Problem: Pain Management: Goal: Pain level will decrease Outcome: Completed/Met

## 2021-05-22 ENCOUNTER — Ambulatory Visit: Payer: Self-pay | Admitting: Thoracic Surgery (Cardiothoracic Vascular Surgery)

## 2021-05-29 ENCOUNTER — Ambulatory Visit: Payer: Self-pay | Admitting: Thoracic Surgery (Cardiothoracic Vascular Surgery)

## 2021-05-29 ENCOUNTER — Telehealth (INDEPENDENT_AMBULATORY_CARE_PROVIDER_SITE_OTHER): Payer: Self-pay | Admitting: Thoracic Surgery (Cardiothoracic Vascular Surgery)

## 2021-05-29 ENCOUNTER — Other Ambulatory Visit: Payer: Self-pay

## 2021-05-29 DIAGNOSIS — R911 Solitary pulmonary nodule: Secondary | ICD-10-CM

## 2021-05-29 NOTE — Progress Notes (Signed)
     [address]       [place] [address]             [phone]       Patient: Home Provider: Office Consent for Telemedicine visit obtained.  Today's visit was completed via a real-time telehealth (see specific modality noted below). The patient/authorized person provided oral consent at the time of the visit to engage in a telemedicine encounter with the present provider at [facility]. The patient/authorized person was informed of the potential benefits, limitations, and risks of telemedicine. The patient/authorized person expressed understanding that the laws that protect confidentiality also apply to telemedicine. The patient/authorized person acknowledged understanding that telemedicine does not provide emergency services and that he or she would need to call 911 or proceed to the nearest hospital for help if such a need arose.   Total time spent in the clinical discussion 10 minutes.  Telehealth Modality: Phone visit (audio only)  I had a telephone visit with [name].  He is still in the nursing home.  He still remains quite weak and is not working much with physical therapy.  He continues to have some incisional pain.  He denies any shortness of breath.  We will follow-up with him in 1 month with a chest x-ray.   Pathology: A. LUNG, LEFT UPPER LOBE, LOBECTOMY:  - Poorly differentiated non-small cell carcinoma, 2.5 cm.  - Margins not involved.  - Perivascular lymph node negative for carcinoma.  - See oncology table and comment.   B. LYMPH NODE, LEVEL 9 #1, EXCISION:  - Anthracotic lymph node negative for carcinoma.   C. LYMPH NODE, HILAR #1, EXCISION:  - Anthracotic lymph node negative for carcinoma.   D. LYMPH NODE, HILAR #2, EXCISION:  - Anthracotic lymph node negative for carcinoma.   E. LYMPH NODE, LEVEL 5 #1, EXCISION:  - Anthracotic lymph node negative for carcinoma.   F. LYMPH NODE, LEVEL 5 #2, EXCISION:  - Anthracotic lymph node negative  for carcinoma.   G. LYMPH NODE, HILAR #3, EXCISION:  - Anthracotic lymph node negative for carcinoma.   H. LYMPH NODE, HILAR #4, EXCISION:  - Anthracotic lymph node negative for carcinoma.   I. LYMPH NODE, HILAR #5, EXCISION:  - Anthracotic lymph node negative for carcinoma.   J. PLEURA, APICAL, BIOPSY:  - Pleura with fibrosis and mild chronic inflammation.  - No malignancy identified.   ONCOLOGY TABLE:  LUNG: Resection  Total Number of Primary Tumors: 1  Procedure: Left upper lobectomy, lymph node biopsies and left apical  pleural biopsy.  Specimen Laterality: Left.  Tumor Focality: Unifocal.  Tumor Site: Left upper lobe.  Tumor Size: 2.5 x 2 x 1.8 cm.  Visceral Pleura Invasion: Not identified, see comment.  Lymphovascular Invasion: Not identified.  Margins: All surgical margins negative for carcinoma.  Regional Lymph Nodes:       Number of Lymph Nodes Involved: 0                            Nodal Sites with Tumor: N/A       Number of Lymph Nodes Examined: 9  Pathologic Stage Classification (pTNM, AJCC 8th Edition): pT1c, pN0

## 2021-06-19 ENCOUNTER — Other Ambulatory Visit: Payer: Self-pay | Admitting: Thoracic Surgery (Cardiothoracic Vascular Surgery)

## 2021-06-19 DIAGNOSIS — R911 Solitary pulmonary nodule: Secondary | ICD-10-CM

## 2021-06-24 ENCOUNTER — Ambulatory Visit: Payer: Self-pay | Admitting: Thoracic Surgery (Cardiothoracic Vascular Surgery)

## 2021-06-26 ENCOUNTER — Ambulatory Visit
Admission: RE | Admit: 2021-06-26 | Discharge: 2021-06-26 | Disposition: A | Discharge status: Home / Self Care | Payer: No Typology Code available for payment source | Source: Ambulatory Visit | Attending: Thoracic Surgery (Cardiothoracic Vascular Surgery) | Admitting: Thoracic Surgery (Cardiothoracic Vascular Surgery)

## 2021-06-26 ENCOUNTER — Ambulatory Visit (INDEPENDENT_AMBULATORY_CARE_PROVIDER_SITE_OTHER): Payer: Self-pay | Admitting: Thoracic Surgery (Cardiothoracic Vascular Surgery)

## 2021-06-26 ENCOUNTER — Other Ambulatory Visit: Payer: Self-pay

## 2021-06-26 ENCOUNTER — Encounter: Payer: Self-pay | Admitting: *Deleted

## 2021-06-26 VITALS — BP 129/83 | HR 104 | Resp 18 | Ht 72.0 in | Wt 136.0 lb

## 2021-06-26 DIAGNOSIS — Z902 Acquired absence of lung [part of]: Secondary | ICD-10-CM

## 2021-06-26 DIAGNOSIS — R911 Solitary pulmonary nodule: Secondary | ICD-10-CM

## 2021-06-26 NOTE — Progress Notes (Signed)
I received referral on Kurt Bolton today.  I notified new patient coordinator to call and schedule Kurt Bolton to be seen on 10/17.

## 2021-06-26 NOTE — Progress Notes (Signed)
      [address]       [place] [address]             [phone]        Kurt Bolton [place] Medical Record #[id number] Date of Birth: 10-Oct-1945  Referring: Rhoderick Moody, MD Primary Care: Windy Fast, MD Primary Cardiologist:None  Reason for visit:   follow-up  History of Present Illness:     [name] presents in follow-up.  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  He has been discharged from the nursing home and is back at his house.  He denies any chest pain or shortness of breath.  Physical Exam: BP 129/83 (BP Location: Right Arm, Patient Position: Sitting)   Pulse (!) 104   Resp 18   Ht 6' (1.829 m)   Wt 136 lb (61.7 kg)   SpO2 99% Comment: RA  BMI 18.44 kg/m   Alert NAD Incision clean.  Sternum  Abdomen soft, ND no peripheral edema   Diagnostic Studies & Laboratory data: CXR: clear Path: A. LUNG, LEFT UPPER LOBE, LOBECTOMY:  - Poorly differentiated non-small cell carcinoma, 2.5 cm.  - Margins not involved.  - Perivascular lymph node negative for carcinoma.  - See oncology table and comment.   B. LYMPH NODE, LEVEL 9 #1, EXCISION:  - Anthracotic lymph node negative for carcinoma.   C. LYMPH NODE, HILAR #1, EXCISION:  - Anthracotic lymph node negative for carcinoma.   D. LYMPH NODE, HILAR #2, EXCISION:  - Anthracotic lymph node negative for carcinoma.   E. LYMPH NODE, LEVEL 5 #1, EXCISION:  - Anthracotic lymph node negative for carcinoma.   F. LYMPH NODE, LEVEL 5 #2, EXCISION:  - Anthracotic lymph node negative for carcinoma.   G. LYMPH NODE, HILAR #3, EXCISION:  - Anthracotic lymph node negative for carcinoma.   H. LYMPH NODE, HILAR #4, EXCISION:  - Anthracotic lymph node negative for carcinoma.   I. LYMPH NODE, HILAR #5, EXCISION:  - Anthracotic lymph node negative for carcinoma.   J. PLEURA, APICAL, BIOPSY:  - Pleura with fibrosis and  mild chronic inflammation.  - No malignancy identified.   ONCOLOGY TABLE:  LUNG: Resection  Total Number of Primary Tumors: 1  Procedure: Left upper lobectomy, lymph node biopsies and left apical  pleural biopsy.  Specimen Laterality: Left.  Tumor Focality: Unifocal.  Tumor Site: Left upper lobe.  Tumor Size: 2.5 x 2 x 1.8 cm.  Visceral Pleura Invasion: Not identified, see comment.  Lymphovascular Invasion: Not identified.  Margins: All surgical margins negative for carcinoma.  Regional Lymph Nodes:       Number of Lymph Nodes Involved: 0                            Nodal Sites with Tumor: N/A       Number of Lymph Nodes Examined: 9  Pathologic Stage Classification (pTNM, AJCC 8th Edition): pT1c, pN0     Assessment / Plan:   75 year old male status post left upper lobectomy for a T1 cN0 M0 non-small cell lung cancer.  Overall doing well.  He will require ongoing surveillance.  I will refer him to medical oncology to accomplish this.   Lajuana Matte 06/26/2021 6:32 PM

## 2021-06-29 ENCOUNTER — Telehealth: Payer: Self-pay | Admitting: Internal Medicine

## 2021-06-29 NOTE — Telephone Encounter (Signed)
Scheduled appt per 9/30 referral. Pt aware is aware of appt date and time.

## 2021-07-10 ENCOUNTER — Ambulatory Visit: Payer: No Typology Code available for payment source | Admitting: Thoracic Surgery (Cardiothoracic Vascular Surgery)

## 2021-07-13 ENCOUNTER — Other Ambulatory Visit: Payer: Self-pay

## 2021-07-13 ENCOUNTER — Inpatient Hospital Stay: Payer: No Typology Code available for payment source | Attending: Internal Medicine | Admitting: Internal Medicine

## 2021-07-13 ENCOUNTER — Inpatient Hospital Stay: Payer: No Typology Code available for payment source

## 2021-07-13 VITALS — BP 135/82 | HR 98 | Temp 97.4°F | Resp 20 | Ht 72.0 in | Wt 136.5 lb

## 2021-07-13 DIAGNOSIS — C3412 Malignant neoplasm of upper lobe, left bronchus or lung: Secondary | ICD-10-CM | POA: Diagnosis not present

## 2021-07-13 DIAGNOSIS — Z7982 Long term (current) use of aspirin: Secondary | ICD-10-CM | POA: Insufficient documentation

## 2021-07-13 DIAGNOSIS — Z902 Acquired absence of lung [part of]: Secondary | ICD-10-CM

## 2021-07-13 DIAGNOSIS — F17210 Nicotine dependence, cigarettes, uncomplicated: Secondary | ICD-10-CM | POA: Insufficient documentation

## 2021-07-13 DIAGNOSIS — C3490 Malignant neoplasm of unspecified part of unspecified bronchus or lung: Secondary | ICD-10-CM

## 2021-07-13 DIAGNOSIS — Z79899 Other long term (current) drug therapy: Secondary | ICD-10-CM | POA: Insufficient documentation

## 2021-07-13 LAB — CMP (CANCER CENTER ONLY)
ALT: 19 U/L (ref 0–44)
AST: 25 U/L (ref 15–41)
Albumin: 3.9 g/dL (ref 3.5–5.0)
Alkaline Phosphatase: 121 U/L (ref 38–126)
Anion gap: 10 (ref 5–15)
BUN: 19 mg/dL (ref 8–23)
CO2: 26 mmol/L (ref 22–32)
Calcium: 9.7 mg/dL (ref 8.9–10.3)
Chloride: 105 mmol/L (ref 98–111)
Creatinine: 0.85 mg/dL (ref 0.61–1.24)
GFR, Estimated: >60 mL/min (ref >60)
Glucose, Bld: 93 mg/dL (ref 70–99)
Potassium: 4.3 mmol/L (ref 3.5–5.1)
Sodium: 141 mmol/L (ref 135–145)
Total Bilirubin: 0.3 mg/dL (ref 0.3–1.2)
Total Protein: 7 g/dL (ref 6.5–8.1)

## 2021-07-13 LAB — CBC WITH DIFFERENTIAL (CANCER CENTER ONLY)
Abs Immature Granulocytes: 0.03 10*3/uL (ref 0.00–0.07)
Basophils Absolute: 0.1 10*3/uL (ref 0.0–0.1)
Basophils Relative: 1 %
Eosinophils Absolute: 0.2 10*3/uL (ref 0.0–0.5)
Eosinophils Relative: 2 %
HCT: 43.4 % (ref 39.0–52.0)
Hemoglobin: 14.3 g/dL (ref 13.0–17.0)
Immature Granulocytes: 0 %
Lymphocytes Relative: 26 %
Lymphs Abs: 2.1 10*3/uL (ref 0.7–4.0)
MCH: 30 pg (ref 26.0–34.0)
MCHC: 32.9 g/dL (ref 30.0–36.0)
MCV: 91.2 fL (ref 80.0–100.0)
Monocytes Absolute: 0.9 10*3/uL (ref 0.1–1.0)
Monocytes Relative: 11 %
Neutro Abs: 4.9 10*3/uL (ref 1.7–7.7)
Neutrophils Relative %: 60 %
Platelet Count: 246 10*3/uL (ref 150–400)
RBC: 4.76 MIL/uL (ref 4.22–5.81)
RDW: 13.9 % (ref 11.5–15.5)
WBC Count: 8.1 10*3/uL (ref 4.0–10.5)
nRBC: 0 % (ref 0.0–0.2)

## 2021-07-13 NOTE — Progress Notes (Signed)
[place] [phone]   [phone]  CONSULT NOTE  REFERRING PHYSICIAN: Dr. Melodie Bouillon  REASON FOR CONSULTATION:  75 years old white male recently diagnosed with lung cancer.  HPI Kurt Bolton is a 75 y.o. male with past medical history significant for COPD, depression, dyslipidemia, and long history of smoking.  The patient was undergoing CT screening of the lung at the [facility] facility and he was found to have a suspicious left apical lung nodule.  He was referred to Dr. Kipp Brood for evaluation.  A PET scan was performed on 03/13/2021 and that showed left apical spiculated hypermetabolic pulmonary nodule measuring 1.8 x 1.9 cm with SUV max of 10.1.  There is no thoracic nodal hypermetabolism.  There was no evidence of extrathoracic metastasis.  The patient had CT super D of the chest on April 22, 2021 and that showed 1.9 cm spiculated left apical nodule corresponding to the hypermetabolic lesion noted on the recent PET/CT.  On 04/30/2021 the patient underwent flexible video fiberoptic bronchoscopy with robotic assistant and biopsies under the care of Dr. Valeta Harms followed by robotic assisted left video thoracoscopy with left upper lobectomy, apical pleurectomy and mediastinal lymph node sampling under the care of Dr. Kipp Brood. The surgery was complicated with left hemothorax requiring chest tube placement.  The patient spent 2 weeks in the hospital in 3 weeks at a rehab facility after the surgery.  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 Immunohistochemistry, see comment.  Representative Tumor Block: A3, A4 and A5.The carcinoma is poorly differentiated characterized by epithelioid cells with marked nuclear pleomorphism and irregularity. There are also foci with vague spindle cell morphology. Immunohistochemistry shows the tumor is positive for TTF-1, cytokeratin 7 and p63 and is negative with  cytokeratin 5/6, Napsin A and p40.  The immunophenotype is most consistent with poorly differentiated adenocarcinoma. Dr. Kipp Brood kindly referred the patient to me today for evaluation and recommendation regarding treatment of his condition. When seen today he is feeling fine except for fatigue and shortness of breath with exertion but no significant chest pain, cough or hemoptysis.  He denied having any recent weight loss or night sweats.  He has no nausea, vomiting, diarrhea or constipation.  He has no headache or visual changes. Family history significant for father died from myocardial infarction at age 79 and mother had congestive heart failure and died at age 62. The patient is single and has no children.  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  He has a history for smoking 1.5 pack/day for around 60 years and he still smoking lightly.  He also drinks alcohol occasionally and no history of drug abuse.  HPI  Past Medical History:  Diagnosis Date   Anginal pain ([place]) 2019   pt said angina was caused by [facility]. John's wart and he stopped taking   COPD (chronic obstructive pulmonary disease) ([place]) 2010   Depression    Dyspnea    with exertion - no oxygen   Headache 2010   had to go to ER, no abnormalities found   HLD (hyperlipidemia)    on lipitor   Pneumonia 2000    Past Surgical History:  Procedure Laterality Date   BRONCHIAL BRUSHINGS  04/30/2021   Procedure: BRONCHIAL BRUSHINGS;  Surgeon: Garner Nash, DO;  Location: [facility];  Service: Pulmonary;;   BRONCHIAL NEEDLE ASPIRATION BIOPSY  04/30/2021   Procedure:  BRONCHIAL NEEDLE ASPIRATION BIOPSIES;  Surgeon: Garner Nash, DO;  Location: [facility];  Service: Pulmonary;;   DIAGNOSTIC LAPAROSCOPY  2017   pt states he had double inguinal hernias   EYE SURGERY Bilateral 2000   cataracts in both eyes   FIDUCIAL MARKER PLACEMENT  04/30/2021   Procedure:  FIDUCIAL DYE MARKING;  Surgeon: Garner Nash, DO;  Location: [facility];  Service: Pulmonary;;   HERNIA REPAIR Bilateral 2017   INTERCOSTAL NERVE BLOCK Left 04/30/2021   Procedure: INTERCOSTAL NERVE BLOCK;  Surgeon: Lajuana Matte, MD;  Location: [place];  Service: Thoracic;  Laterality: Left;   LYMPH NODE BIOPSY Left 04/30/2021   Procedure: LYMPH NODE BIOPSY;  Surgeon: Lajuana Matte, MD;  Location: [facility];  Service: Thoracic;  Laterality: Left;   [place]   pt states taken out when he was a child   VIDEO ASSISTED THORACOSCOPY Left 05/01/2021   Procedure: VIDEO ASSISTED THORACOSCOPY;  Surgeon: Lajuana Matte, MD;  Location: [place];  Service: Thoracic;  Laterality: Left;   VIDEO BRONCHOSCOPY WITH ENDOBRONCHIAL NAVIGATION Left 04/30/2021   Procedure: VIDEO BRONCHOSCOPY WITH ENDOBRONCHIAL NAVIGATION;  Surgeon: Garner Nash, DO;  Location: [place];  Service: Pulmonary;  Laterality: Left;  ION, w/ FIducial Dye Marking (MB:ICG)   VIDEO BRONCHOSCOPY WITH RADIAL ENDOBRONCHIAL ULTRASOUND  04/30/2021   Procedure: VIDEO BRONCHOSCOPY WITH RADIAL ENDOBRONCHIAL ULTRASOUND;  Surgeon: Garner Nash, DO;  Location: [facility];  Service: Pulmonary;;    No family history on file.  Social History Social History   Tobacco Use   Smoking status: Former    Packs/day: 2.00    Types: Cigarettes    Quit date: 04/28/2021    Years since quitting: 0.2   Smokeless tobacco: Never  Vaping Use   Vaping Use: Former   Quit date: 03/27/2021  Substance Use Topics   Alcohol use: Yes    Alcohol/week: 2.0 - 3.0 standard drinks    Types: 1 Shots of liquor, 1 - 2 Standard drinks or equivalent per week    Comment: pt states he has "a drink about once a week"   Drug use: Never    No Known Allergies  Current Outpatient Medications  Medication Sig Dispense Refill   aspirin EC 325 MG EC tablet Take 1 tablet (325 mg total) by mouth daily. 30 tablet 0   atorvastatin (LIPITOR) 40 MG tablet  Take 40 mg by mouth every evening.     buPROPion ER (WELLBUTRIN SR) 100 MG 12 hr tablet Take 100 mg by mouth daily as needed (depression).     gabapentin (NEURONTIN) 100 MG capsule Take 200 mg by mouth at bedtime as needed (pain).     Multiple Vitamin (MULTIVITAMIN) tablet Take 1 tablet by mouth daily.     nicotine (NICODERM CQ - DOSED IN MG/24 HOURS) 14 mg/24hr patch Place 1 patch (14 mg total) onto the skin daily. 28 patch 0   Omega-3 Fatty Acids (FISH OIL) 1000 MG CAPS Take 1,000 mg by mouth in the morning and at bedtime.     oxymetazoline (AFRIN) 0.05 % nasal spray Place 1 spray into both nostrils 2 (two) times daily as needed for congestion.     phenylephrine (NEO-SYNEPHRINE) 1 % nasal spray Place 1 drop into both nostrils every 6 (six) hours as needed for congestion.     sildenafil (VIAGRA) 100 MG tablet Take 100 mg by mouth as needed.     Vitamins A & D (VITAMIN A & D) ointment  Apply 1 application topically as needed for dry skin.     No current facility-administered medications for this visit.    Review of Systems  Constitutional: positive for fatigue Eyes: negative Ears, nose, mouth, throat, and face: negative Respiratory: positive for dyspnea on exertion Cardiovascular: negative Gastrointestinal: negative Genitourinary:negative Integument/breast: negative Hematologic/lymphatic: negative Musculoskeletal:negative Neurological: negative Behavioral/Psych: negative Endocrine: negative Allergic/Immunologic: negative  Physical Exam  XTG:GYIRS, healthy, no distress, well nourished, and well developed SKIN: skin color, texture, turgor are normal, no rashes or significant lesions HEAD: Normocephalic, No masses, lesions, tenderness or abnormalities EYES: normal, PERRLA, Conjunctiva are pink and non-injected EARS: External ears normal, Canals clear OROPHARYNX:no exudate, no erythema, and lips, buccal mucosa, and tongue normal  NECK: supple, no adenopathy, no JVD LYMPH:  no  palpable lymphadenopathy, no hepatosplenomegaly LUNGS: clear to auscultation , and palpation HEART: regular rate & rhythm, no murmurs, and no gallops ABDOMEN:abdomen soft, non-tender, normal bowel sounds, and no masses or organomegaly BACK: Back symmetric, no curvature., No CVA tenderness EXTREMITIES:no joint deformities, effusion, or inflammation, no edema  NEURO: alert & oriented x 3 with fluent speech, no focal motor/sensory deficits  PERFORMANCE STATUS: ECOG 1  LABORATORY DATA: Lab Results  Component Value Date   WBC 8.1 07/13/2021   HGB 14.3 07/13/2021   HCT 43.4 07/13/2021   MCV 91.2 07/13/2021   PLT 246 07/13/2021      Chemistry      Component Value Date/Time   NA 134 (L) 05/07/2021 0046   K 3.8 05/07/2021 0046   CL 103 05/07/2021 0046   CO2 24 05/07/2021 0046   BUN 14 05/07/2021 0046   CREATININE 0.68 05/07/2021 0046      Component Value Date/Time   CALCIUM 8.4 (L) 05/07/2021 0046   ALKPHOS 33 (L) 05/02/2021 0200   AST 30 05/02/2021 0200   ALT 13 05/02/2021 0200   BILITOT 0.9 05/02/2021 0200       RADIOGRAPHIC STUDIES: DG Chest 2 View  Result Date: 06/26/2021 CLINICAL DATA:  History of VATS on the left 05/01/2021 EXAM: CHEST - 2 VIEW COMPARISON:  05/12/2021 FINDINGS: Normal heart size. Postsurgical changes at the left lung apex. Chronic right apical scarring. Calcified granuloma in the right mid lung. No new focal airspace consolidation. No pleural effusion or pneumothorax. IMPRESSION: Stable postoperative appearance of the chest.  No acute findings. Electronically Signed   By: Davina Poke D.O.   On: 06/26/2021 11:39    ASSESSMENT: This is a very pleasant 75 years old white male recently diagnosed with a stage Ia (T1c, N0, M0) non-small cell lung cancer, adenocarcinoma presented with left apical lymph node status post left upper lobectomy with lymph node sampling under the care of Dr. Kipp Brood on 04/30/2021.   PLAN: I had a lengthy discussion with the  patient today about his current disease stage, prognosis and treatment options. I reviewed his previous imaging studies as well as pathology reports. I explained to the patient that he had a curable treatment for his condition with the left upper lobectomy. I explained to the patient that there is no survival benefit for adjuvant systemic chemotherapy for patient with a stage Ia non-small cell lung cancer and the current standard of care is observation and close monitoring. I recommended for the patient to continue on observation with repeat CT scan of the chest in 6 months. For the smoking cessation I strongly encouraged him to quit smoking. I will see him back for follow-up visit in 6 months with repeat CT scan  of the chest. He was advised to call immediately if he has any other concerning symptoms in the interval. The patient voices understanding of current disease status and treatment options and is in agreement with the current care plan.  All questions were answered. The patient knows to call the clinic with any problems, questions or concerns. We can certainly see the patient much sooner if necessary.  Thank you so much for allowing me to participate in the care of Almedia Balls. I will continue to follow up the patient with you and assist in his care.  The total time spent in the appointment was 60 minutes.  Disclaimer: This note was dictated with voice recognition software. Similar sounding words can inadvertently be transcribed and may not be corrected upon review.   Eilleen Kempf July 13, 2021, 2:12 PM

## 2021-07-14 ENCOUNTER — Telehealth: Payer: Self-pay | Admitting: Internal Medicine

## 2021-07-14 NOTE — Telephone Encounter (Signed)
Scheduled follow-up appointments per 10/17 los. Patient is aware. Mailed calendar.

## 2022-01-08 ENCOUNTER — Inpatient Hospital Stay: Payer: No Typology Code available for payment source | Attending: Internal Medicine

## 2022-01-11 ENCOUNTER — Inpatient Hospital Stay: Payer: No Typology Code available for payment source | Admitting: Internal Medicine

## 2023-05-08 IMAGING — DX DG CHEST 2V
2 series · 2 of 2 positions shown · non-contrast
Comparison: 05/12/2021

CLINICAL DATA: History of VATS on the left 05/01/2021

EXAM:
CHEST - 2 VIEW

[dg chest 2 view (1 of 2)]
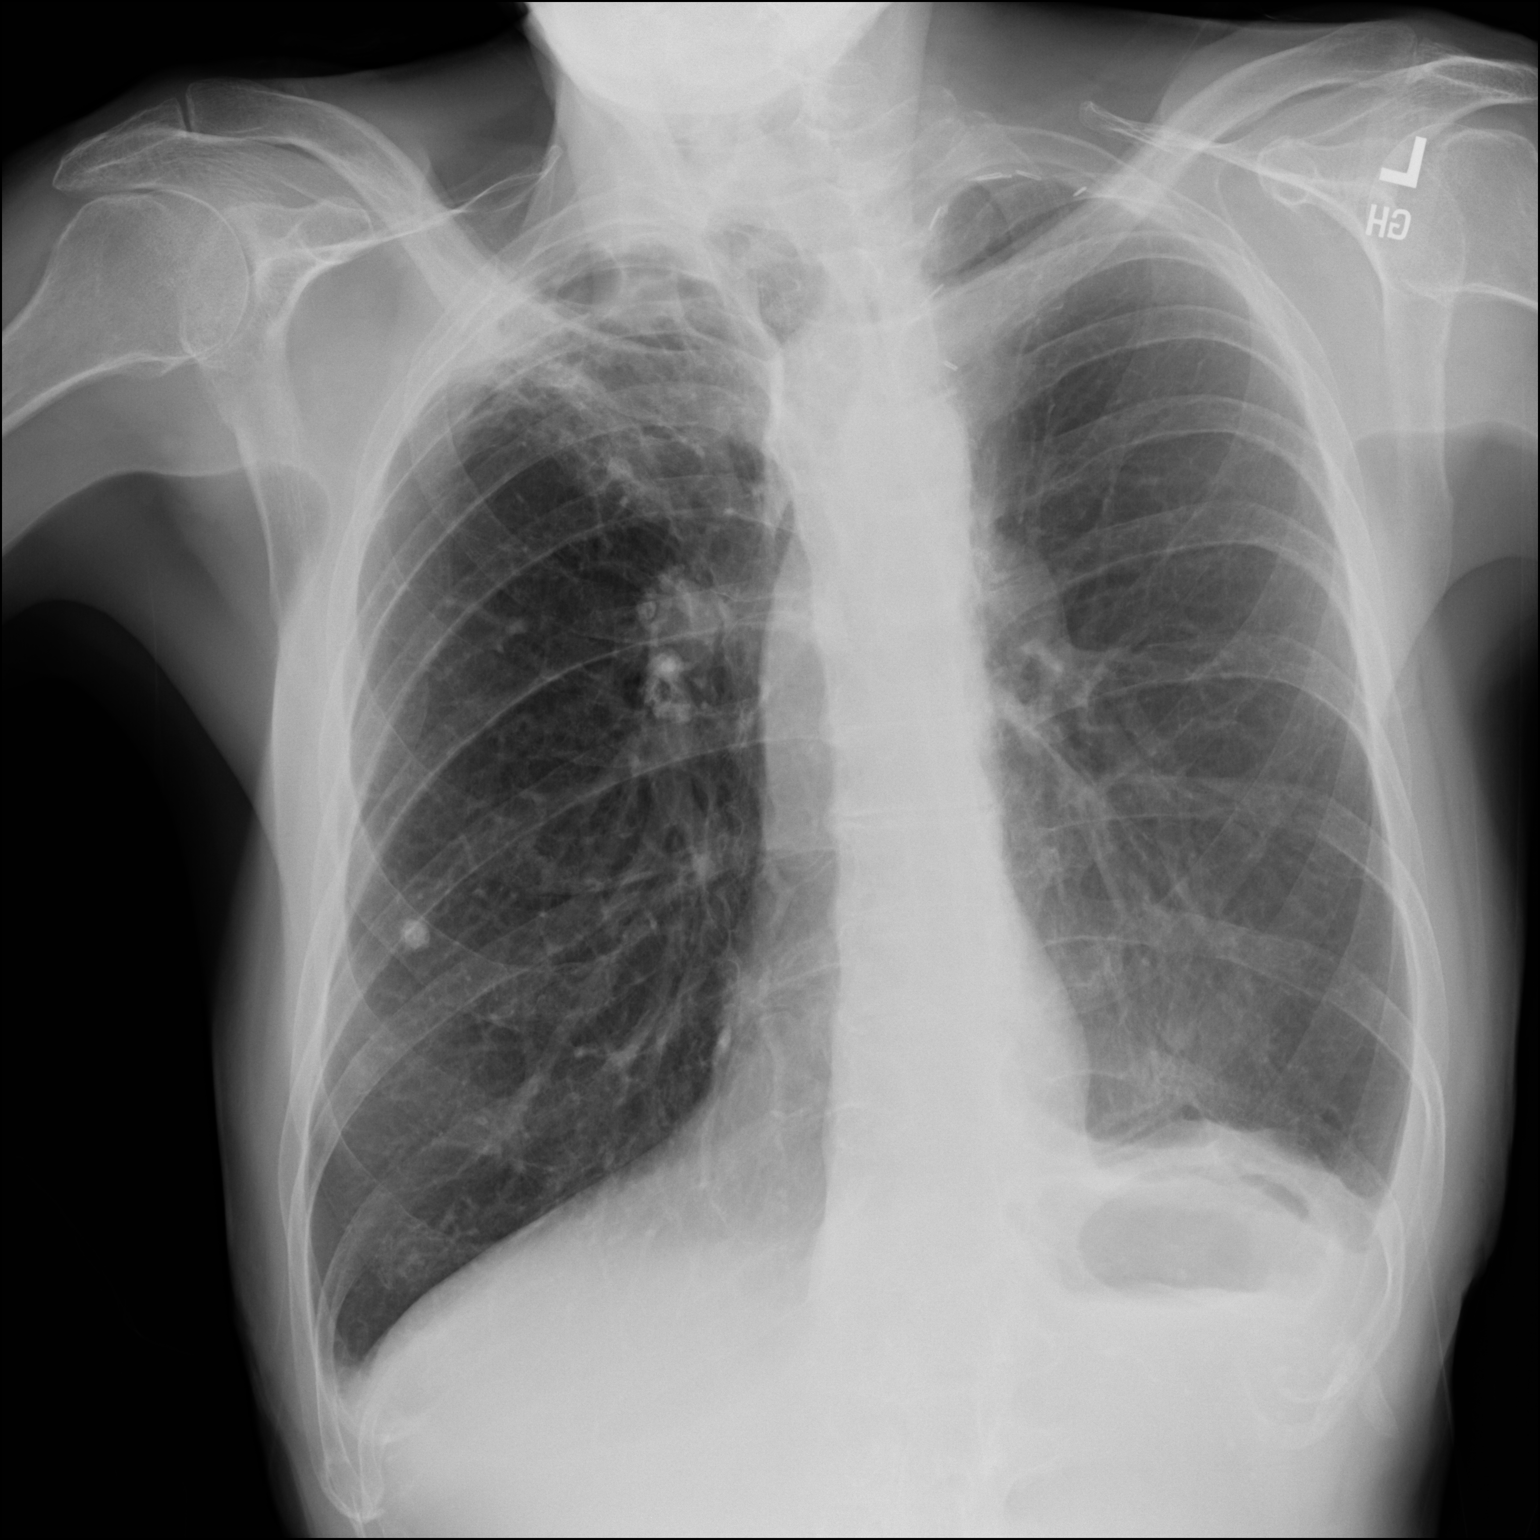

[dg chest 2 view (2 of 2)]
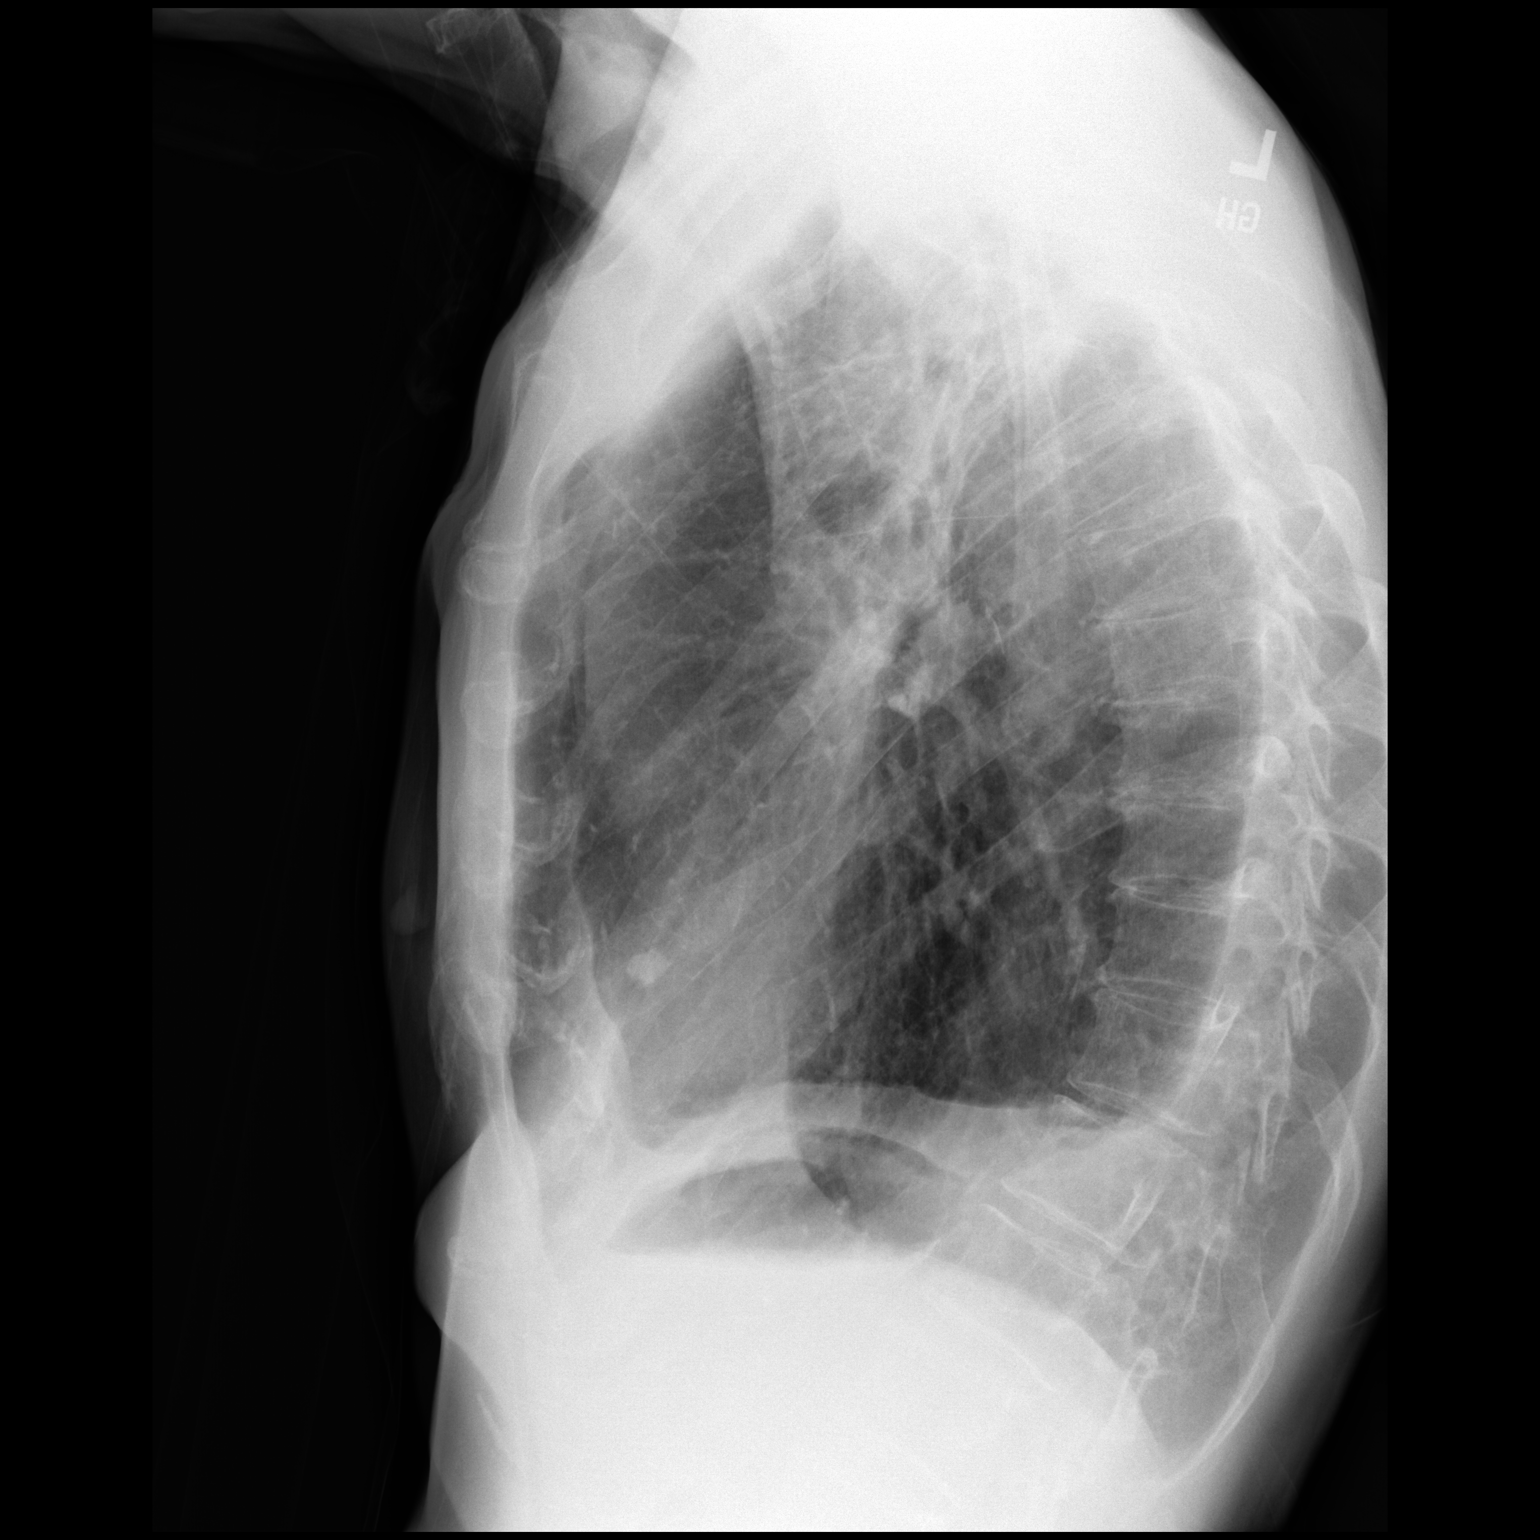

[2 of 2 positions shown; findings below may reference images not displayed]

FINDINGS: Normal heart size. Postsurgical changes at the left lung apex.
Chronic right apical scarring. Calcified granuloma in the right mid
lung. No new focal airspace consolidation. No pleural effusion or
pneumothorax.
IMPRESSION: Stable postoperative appearance of the chest.  No acute findings.
